# Patient Record
Sex: Female | Born: 1990 | Race: Black or African American | Hispanic: No | Marital: Single | Smoking: Never smoker
Health system: Southern US, Community
[De-identification: ages and names within clinical notes are randomized; demographics above are authoritative.]

## PROBLEM LIST (undated history)

## (undated) ENCOUNTER — Inpatient Hospital Stay (HOSPITAL_COMMUNITY): Payer: Self-pay

## (undated) DIAGNOSIS — F419 Anxiety disorder, unspecified: Secondary | ICD-10-CM

## (undated) HISTORY — PX: NO PAST SURGERIES: SHX2092

---
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
## 2010-12-17 ENCOUNTER — Emergency Department (HOSPITAL_COMMUNITY)
Admission: EM | Admit: 2010-12-17 | Discharge: 2010-12-17 | Discharge status: Against Medical Advice | Payer: Self-pay | Attending: Emergency Medicine | Admitting: Emergency Medicine

## 2010-12-17 DIAGNOSIS — R109 Unspecified abdominal pain: Secondary | ICD-10-CM | POA: Insufficient documentation

## 2010-12-17 DIAGNOSIS — R42 Dizziness and giddiness: Secondary | ICD-10-CM | POA: Insufficient documentation

## 2010-12-17 LAB — URINALYSIS, ROUTINE W REFLEX MICROSCOPIC
Bilirubin Urine: NEGATIVE
Glucose, UA: NEGATIVE mg/dL
Protein, ur: NEGATIVE mg/dL
pH: 6 (ref 5.0–8.0)

## 2010-12-22 ENCOUNTER — Emergency Department (HOSPITAL_COMMUNITY)
Admission: EM | Admit: 2010-12-22 | Discharge: 2010-12-22 | Disposition: A | Discharge status: Home / Self Care | Payer: Self-pay | Attending: Emergency Medicine | Admitting: Emergency Medicine

## 2010-12-22 DIAGNOSIS — B9689 Other specified bacterial agents as the cause of diseases classified elsewhere: Secondary | ICD-10-CM | POA: Insufficient documentation

## 2010-12-22 DIAGNOSIS — N72 Inflammatory disease of cervix uteri: Secondary | ICD-10-CM | POA: Insufficient documentation

## 2010-12-22 DIAGNOSIS — N760 Acute vaginitis: Secondary | ICD-10-CM | POA: Insufficient documentation

## 2010-12-22 DIAGNOSIS — A499 Bacterial infection, unspecified: Secondary | ICD-10-CM | POA: Insufficient documentation

## 2010-12-22 LAB — URINALYSIS, ROUTINE W REFLEX MICROSCOPIC
Glucose, UA: NEGATIVE mg/dL
Hgb urine dipstick: NEGATIVE
Specific Gravity, Urine: 1.026 (ref 1.005–1.030)
pH: 6.5 (ref 5.0–8.0)

## 2010-12-22 LAB — WET PREP, GENITAL
Trich, Wet Prep: NONE SEEN
Yeast Wet Prep HPF POC: NONE SEEN

## 2010-12-23 LAB — GC/CHLAMYDIA PROBE AMP, GENITAL: GC Probe Amp, Genital: NEGATIVE

## 2010-12-28 LAB — CBC
Hemoglobin: 10.4 g/dL — ABNORMAL LOW (ref 12.0–15.0)
MCHC: 34.4 g/dL (ref 30.0–36.0)
MCV: 79.1 fL (ref 78.0–100.0)
Platelets: 179 10*3/uL (ref 150–400)
Platelets: 193 10*3/uL (ref 150–400)
RDW: 13.8 % (ref 11.5–15.5)
WBC: 14.7 10*3/uL — ABNORMAL HIGH (ref 4.0–10.5)

## 2010-12-28 LAB — DIFFERENTIAL
Basophils Relative: 0 % (ref 0–1)
Eosinophils Absolute: 0 10*3/uL (ref 0.0–0.7)
Lymphs Abs: 1.2 10*3/uL (ref 0.7–4.0)
Neutrophils Relative %: 89 % — ABNORMAL HIGH (ref 43–77)

## 2010-12-28 LAB — ABO/RH: ABO/RH(D): B POS

## 2010-12-28 LAB — HEPATITIS B SURFACE ANTIGEN: Hepatitis B Surface Ag: NEGATIVE

## 2010-12-28 LAB — HIV ANTIBODY (ROUTINE TESTING W REFLEX): HIV: NONREACTIVE

## 2010-12-29 LAB — POCT I-STAT, CHEM 8
BUN: 8 mg/dL (ref 6–23)
Chloride: 102 mEq/L (ref 96–112)
Sodium: 135 mEq/L (ref 135–145)

## 2010-12-29 LAB — URINALYSIS, ROUTINE W REFLEX MICROSCOPIC
Glucose, UA: NEGATIVE mg/dL
Hgb urine dipstick: NEGATIVE
Ketones, ur: NEGATIVE mg/dL
Protein, ur: NEGATIVE mg/dL

## 2010-12-29 LAB — ABO/RH: ABO/RH(D): B POS

## 2010-12-29 LAB — HCG, QUANTITATIVE, PREGNANCY: hCG, Beta Chain, Quant, S: 36401 m[IU]/mL — ABNORMAL HIGH (ref <5)

## 2010-12-29 LAB — WET PREP, GENITAL
Trich, Wet Prep: NONE SEEN
Yeast Wet Prep HPF POC: NONE SEEN

## 2011-01-20 ENCOUNTER — Emergency Department (HOSPITAL_COMMUNITY)
Admission: EM | Admit: 2011-01-20 | Discharge: 2011-01-20 | Disposition: A | Discharge status: Home / Self Care | Payer: Self-pay | Attending: Emergency Medicine | Admitting: Emergency Medicine

## 2011-01-20 DIAGNOSIS — H109 Unspecified conjunctivitis: Secondary | ICD-10-CM | POA: Insufficient documentation

## 2011-01-20 DIAGNOSIS — H5710 Ocular pain, unspecified eye: Secondary | ICD-10-CM | POA: Insufficient documentation

## 2011-02-04 NOTE — Discharge Summary (Signed)
Emily Oconnor, Emily Oconnor            ACCOUNT NO.:  [id number]   MEDICAL RECORD NO.:  [id number]          PATIENT TYPE:  OBV   LOCATION:  9305                          FACILITY:  [facility]   PHYSICIAN:  Scheryl Darter, MD       DATE OF BIRTH:  07/07/1991   DATE OF ADMISSION:  04/28/2009  DATE OF DISCHARGE:  04/28/2009                               DISCHARGE SUMMARY   DIAGNOSIS:  Spontaneous miscarriage at 50 week's gestation.   HISTORY OF PRESENT ILLNESS:  The patient is an 20 year old black female,  gravida 1, para 0-0-1-0, who had prenatal care at [facility].  She was seen for colorless bleeding and pain at [facility] yesterday evening and was discharged home and shortly after  returning home, she had spontaneous passage of 15 week fetus.  EMS was  called, and they transported her to this facility.   PAST MEDICAL HISTORY:  Asthma and anxiety.   PAST SURGICAL HISTORY:  None.   SOCIAL HISTORY:  The patient denies substance use.   MEDICATIONS:  None.   ALLERGIES:  No known drug allergies.   PHYSICAL EXAMINATION:  GENERAL:  The patient was in no acute distress.  VITAL SIGNS:  Stable.  CHEST:  Clear.  HEART:  Regular rate and rhythm.  ABDOMEN:  Soft and nontender.   Pelvic exam revealed spontaneous delivery of placenta in the Maternity  Admissions Unit.  Hemoglobin was 10.4 and platelet count 179,000.  Blood  type B positive.  The patient received IV pitocin.  Her bleeding became  scant.  She requested extended observation after miscarriage.  She was  discharged home from observation.  She wished to follow up at her  OB/GYN's office in 2 days.  She was given bleeding precautions.      Scheryl Darter, MD  Electronically Signed     JA/MEDQ  D:  04/28/2009  T:  04/28/2009  Job:  [id number]

## 2011-11-10 ENCOUNTER — Other Ambulatory Visit (HOSPITAL_COMMUNITY)
Admission: RE | Admit: 2011-11-10 | Discharge: 2011-11-10 | Disposition: A | Discharge status: Home / Self Care | Payer: Self-pay | Source: Ambulatory Visit | Attending: Obstetrics & Gynecology | Admitting: Obstetrics & Gynecology

## 2011-11-10 DIAGNOSIS — Z124 Encounter for screening for malignant neoplasm of cervix: Secondary | ICD-10-CM | POA: Insufficient documentation

## 2011-11-10 DIAGNOSIS — Z113 Encounter for screening for infections with a predominantly sexual mode of transmission: Secondary | ICD-10-CM | POA: Insufficient documentation

## 2012-02-07 ENCOUNTER — Encounter (HOSPITAL_COMMUNITY): Payer: Self-pay | Admitting: Emergency Medicine

## 2012-02-07 ENCOUNTER — Emergency Department (HOSPITAL_COMMUNITY)
Admission: EM | Admit: 2012-02-07 | Discharge: 2012-02-07 | Disposition: A | Discharge status: Home / Self Care | Payer: Self-pay | Attending: Emergency Medicine | Admitting: Emergency Medicine

## 2012-02-07 DIAGNOSIS — J45909 Unspecified asthma, uncomplicated: Secondary | ICD-10-CM | POA: Insufficient documentation

## 2012-02-07 DIAGNOSIS — IMO0001 Reserved for inherently not codable concepts without codable children: Secondary | ICD-10-CM | POA: Insufficient documentation

## 2012-02-07 DIAGNOSIS — R51 Headache: Secondary | ICD-10-CM | POA: Insufficient documentation

## 2012-02-07 DIAGNOSIS — J069 Acute upper respiratory infection, unspecified: Secondary | ICD-10-CM | POA: Insufficient documentation

## 2012-02-07 NOTE — Discharge Instructions (Signed)
Upper Respiratory Infection, Adult An upper respiratory infection (URI) is also known as the common cold. It is often caused by a type of germ (virus). Colds are easily spread (contagious). You can pass it to others by kissing, coughing, sneezing, or drinking out of the same glass. Usually, you get better in 1 or 2 weeks.  HOME CARE   Only take medicine as told by your doctor.   Use a warm mist humidifier or breathe in steam from a hot shower.   Drink enough water and fluids to keep your pee (urine) clear or pale yellow.   Get plenty of rest.   Return to work when your temperature is back to normal or as told by your doctor. You may use a face mask and wash your hands to stop your cold from spreading.  GET HELP RIGHT AWAY IF:   After the first few days, you feel you are getting worse.   You have questions about your medicine.   You have chills, shortness of breath, or brown or red spit (mucus).   You have yellow or brown snot (nasal discharge) or pain in the face, especially when you bend forward.   You have a fever, puffy (swollen) neck, pain when you swallow, or white spots in the back of your throat.   You have a bad headache, ear pain, sinus pain, or chest pain.   You have a high-pitched whistling sound when you breathe in and out (wheezing).   You have a lasting cough or cough up blood.   You have sore muscles or a stiff neck.  MAKE SURE YOU:   Understand these instructions.   Will watch your condition.   Will get help right away if you are not doing well or get worse.  Document Released: 02/25/2008 Document Revised: 08/28/2011 Document Reviewed: 01/13/2011 [facility] Patient Information 2012 [place], [place].          Cool Mist Vaporizers Vaporizers may help relieve the symptoms of a cough and cold. By adding water to the air, mucus may become thinner and less sticky. This makes it easier to breathe and cough up secretions. Vaporizers have not been proven to show  they help with colds. You should not use a vaporizer if you are allergic to mold. Cool mist vaporizers do not cause serious burns like hot mist vaporizers ("steamers"). HOME CARE INSTRUCTIONS  Follow the package instructions for your vaporizer.   Use a vaporizer that holds a large volume of water (1 to 2 gallons [5.7 to 7.5 liters]).   Do not use anything other than distilled water in the vaporizer.   Do not run the vaporizer all of the time. This can cause mold or bacteria to grow in the vaporizer.   Clean the vaporizer after each time you use it.   Clean and dry the vaporizer well before you store it.   Stop using a vaporizer if you develop worsening respiratory symptoms.  Document Released: 06/05/2004 Document Revised: 08/28/2011 Document Reviewed: 05/03/2009 [facility] Patient Information 2012 [place], [place].        If you have no primary doctor, here are some resources that may be helpful:  Medicaid-accepting [facility] Providers:   - [facility]- [address]      [phone]      Mon-Fri 9am-7pm, Sat 9am-1pm   - [facility]- [address] [place], [address] [place]      [phone]   - [facility]- [address] [place]      [phone]   [facility]- [address]      [phone]   - Renaye Rakers- [address] [place], [address]      [phone]      Only accepts [place] patients       after they have her name applied to their card   Self Pay (no insurance) in [place]:   - Sickle Cell Patients: Dr Willey Blade, [facility]      [address] [place]      [phone]   - [phone]   - Physician Referral Service- [phone]   - [facility]- [address] [place]      [phone]   [facility] [place]- [address]   - [facility]- see information above      (Speak to [employer] if you  do not have insurance)   - [facility]- [address] [place]      [phone]   - [facility] [place]- [address]      [phone]   - [facility]- [address]      [phone]   - Dr Julio Sicks-  [address], [place]      [phone]   - [facility]- [address]      [phone]   - [facility]- [address]      [phone]      Also [address]      [phone]   - [facility]- [address]      [phone]      1st and 3rd Saturday every month, 10am-1pm Other agencies that provide inexpensive medical care:    [facility]  [phone]    [facility]  [phone]    [facility]  [phone]    [facility]  [phone]    [facility]  [phone]  General Information: Finding a doctor when you do not have health insurance can be tricky. Although you are not limited by an insurance plan, you are of course limited by her finances and how much but he can pay out of pocket.  What are your options if you don't have health insurance?   1) Find a Librarian, academic and Pay Out of Pocket Although you won't have to find out who is covered by your insurance plan, it is a good idea to ask around and get recommendations. You will then need to call the office and see if the doctor you have chosen will accept you as a new patient and what types of options they offer for patients who are self-pay. Some doctors offer discounts or will set up payment plans for their patients who do not have insurance, but you will need to ask so you aren't surprised when you get to your appointment.  2) Contact Your Local Health Department Not all health departments have doctors that can see patients for sick visits, but many do, so it is worth a call to see if yours does. If you don't know where your local health department is, you can check in your phone book. The CDC also has a tool to help you locate  your state's health department, and many state websites also have  listings of all of their local health departments.  3) Find a Walk-in Clinic If your illness is not likely to be very severe or complicated, you may want to try a walk in clinic. These are popping up all over the country in pharmacies, drugstores, and shopping centers. They're usually staffed by nurse practitioners or physician assistants that have been trained to treat common illnesses and complaints. They're usually fairly quick and inexpensive. However, if you have serious medical issues or chronic medical problems, these are probably not your best option

## 2012-02-07 NOTE — ED Provider Notes (Signed)
Medical screening examination/treatment/procedure(s) were performed by non-physician practitioner and as supervising physician I was immediately available for consultation/collaboration.  Jackelyne Sayer, MD 02/07/12 1626 

## 2012-02-07 NOTE — ED Notes (Signed)
Pt report cough, sore throat and chills x 2 days

## 2012-02-07 NOTE — ED Provider Notes (Signed)
History     CSN: [id number]  Arrival date & time 02/07/12  4098   First MD Initiated Contact with Patient 02/07/12 1012      Chief Complaint  Patient presents with  . Cough     2 day hx of cough  . Sore Throat    (Consider location/radiation/quality/duration/timing/severity/associated sxs/prior treatment) Patient is a 21 y.o. female presenting with pharyngitis. The history is provided by the patient.  Sore Throat This is a new problem. Episode onset: 2 days ago. The problem occurs constantly. The problem has been unchanged. Associated symptoms include congestion, coughing, headaches, myalgias and a sore throat. Pertinent negatives include no abdominal pain, chest pain, chills, fever, neck pain, rash, swollen glands or vomiting. The symptoms are aggravated by swallowing. Treatments tried: Robitussin. The treatment provided no relief.    Past Medical History  Diagnosis Date  . Asthma     History reviewed. No pertinent past surgical history.  Family History  Problem Relation Age of Onset  . Diabetes Mother   . Hypertension Mother   . Cancer Mother     History  Substance Use Topics  . Smoking status: Never Smoker   . Smokeless tobacco: Not on file  . Alcohol Use: No     Review of Systems  Constitutional: Negative for fever and chills.  HENT: Positive for congestion and sore throat. Negative for neck pain.   Respiratory: Positive for cough.   Cardiovascular: Negative for chest pain.  Gastrointestinal: Negative for vomiting and abdominal pain.  Musculoskeletal: Positive for myalgias.  Skin: Negative for rash.  Neurological: Positive for headaches.    Allergies  Review of patient's allergies indicates no known allergies.  Home Medications   Current Outpatient Rx  Name Route Sig Dispense Refill  . GUAIFENESIN 100 MG/5ML PO LIQD Oral Take 400 mg by mouth 3 (three) times daily as needed. Cold symptons      BP 107/70  Pulse 98  Temp(Src) 99.5 F (37.5 C)  (Oral)  SpO2 99%  LMP 01/18/2012  Physical Exam  Nursing note reviewed. Constitutional:       Vital signs are reviewed and are normal.   HENT:  Head: Normocephalic and atraumatic.  Right Ear: Hearing, tympanic membrane, external ear and ear canal normal.  Left Ear: Hearing, tympanic membrane, external ear and ear canal normal.  Nose: Rhinorrhea present. Right sinus exhibits no maxillary sinus tenderness. Left sinus exhibits no maxillary sinus tenderness.  Mouth/Throat: Uvula is midline and mucous membranes are normal. No oropharyngeal exudate, posterior oropharyngeal edema, posterior oropharyngeal erythema or tonsillar abscesses.  Neck: Normal range of motion. Neck supple.       Phonation normal  Cardiovascular: Normal rate, regular rhythm and normal heart sounds.   Pulmonary/Chest: Effort normal and breath sounds normal. No respiratory distress. She has no wheezes. She exhibits no tenderness.  Abdominal: Soft. Bowel sounds are normal. She exhibits no distension. There is no tenderness.  Musculoskeletal: She exhibits no edema and no tenderness.  Lymphadenopathy:    She has no cervical adenopathy.  Neurological: She is alert.       MS appears baseline for pt. Speech clear  Skin: Skin is warm and dry.  Psychiatric: She has a normal mood and affect.    ED Course  Procedures (including critical care time)  Labs Reviewed - No data to display No results found.  Dx 1: URI  MDM  Viral URI s/s. Non-toxic appearing, AF, nml VS.   Discussed symptomatic tx with pt, who voices  understanding.      Shaaron Adler, PA-C 02/07/12 1041

## 2012-07-15 ENCOUNTER — Emergency Department (HOSPITAL_COMMUNITY)
Admission: EM | Admit: 2012-07-15 | Discharge: 2012-07-15 | Disposition: A | Discharge status: Home / Self Care | Payer: Self-pay | Attending: Emergency Medicine | Admitting: Emergency Medicine

## 2012-07-15 ENCOUNTER — Encounter (HOSPITAL_COMMUNITY): Payer: Self-pay | Admitting: Emergency Medicine

## 2012-07-15 DIAGNOSIS — G5601 Carpal tunnel syndrome, right upper limb: Secondary | ICD-10-CM

## 2012-07-15 DIAGNOSIS — G5600 Carpal tunnel syndrome, unspecified upper limb: Secondary | ICD-10-CM | POA: Insufficient documentation

## 2012-07-15 DIAGNOSIS — J45909 Unspecified asthma, uncomplicated: Secondary | ICD-10-CM | POA: Insufficient documentation

## 2012-07-15 DIAGNOSIS — Z3202 Encounter for pregnancy test, result negative: Secondary | ICD-10-CM | POA: Insufficient documentation

## 2012-07-15 DIAGNOSIS — Z8709 Personal history of other diseases of the respiratory system: Secondary | ICD-10-CM | POA: Insufficient documentation

## 2012-07-15 MED ORDER — IBUPROFEN 800 MG PO TABS: 800.0000 mg | ORAL_TABLET | Freq: Three times a day (TID) | ORAL | Status: DC

## 2012-07-15 MED ORDER — KETOROLAC TROMETHAMINE 10 MG PO TABS
10.0000 mg | ORAL_TABLET | Freq: Once | ORAL | Status: AC
  Administered 2012-07-15: 10 mg via ORAL
  Filled 2012-07-15: qty 1

## 2012-07-15 NOTE — ED Notes (Signed)
Pt reports right wrist pain for several days.  Pt states she thinks it might be carpal tunnel because she writes a lot for school.  Pt denies any acute injury.  No swelling or deformity noted.

## 2012-07-15 NOTE — ED Provider Notes (Signed)
History     CSN: [id number]  Arrival date & time 07/15/12  1033   First MD Initiated Contact with Patient 07/15/12 1104      Chief Complaint  Patient presents with  . Wrist Pain    (Consider location/radiation/quality/duration/timing/severity/associated sxs/prior treatment) HPI Comments: Patient is an otherwise healthy 21 year old female who presents with right wrist pain for the past 4 days. 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 The patient is right handed. The pain is described as sharp and severe and radiates into her right hand. Patient reports associated numbness and tingling of right hand. Right wrist movement makes the pain worse. Nothing makes the pain better. Patient has tried nothing for symptoms relief. Patient denies known injury, obvious deformity, weakness/coolness of extremity, neck pain, back pain, headache, visual changes.    Past Medical History  Diagnosis Date  . Asthma     History reviewed. No pertinent past surgical history.  Family History  Problem Relation Age of Onset  . Diabetes Mother   . Hypertension Mother   . Cancer Mother     History  Substance Use Topics  . Smoking status: Never Smoker   . Smokeless tobacco: Not on file  . Alcohol Use: No    OB History    Grav Para Term Preterm Abortions TAB SAB Ect Mult Living                  Review of Systems  Musculoskeletal: Positive for arthralgias.  Neurological: Positive for numbness.  All other systems reviewed and are negative.    Allergies  Review of patient's allergies indicates no known allergies.  Home Medications   Current Outpatient Rx  Name Route Sig Dispense Refill  . GUAIFENESIN 100 MG/5ML PO LIQD Oral Take 400 mg by mouth 3 (three) times daily as needed. Cold symptons      BP 123/80  Pulse 78  Temp 98 F (36.7 C) (Oral)  Resp 16  SpO2 96%  LMP 05/27/2012  Physical Exam   Nursing note and vitals reviewed. Constitutional: She is oriented to person, place, and time. She appears well-developed and well-nourished. No distress.  HENT:  Head: Normocephalic and atraumatic.  Eyes: Conjunctivae normal are normal.  Neck: Normal range of motion. Neck supple.  Cardiovascular: Normal rate and regular rhythm.  Exam reveals no gallop and no friction rub.   No murmur heard. Pulmonary/Chest: Effort normal and breath sounds normal. She has no wheezes. She has no rales. She exhibits no tenderness.  Abdominal: Soft. She exhibits no distension.  Musculoskeletal: Normal range of motion.       Right wrist tender to palpation over volar aspect. No edema noted. Phalen test positive. No other joint pain.   Neurological: She is alert and oriented to person, place, and time. Coordination normal.       Strength and sensation equal and intact bilaterally. Speech is goal-oriented. Moves limbs without ataxia.   Skin: Skin is warm and dry. She is not diaphoretic.  Psychiatric: She has a normal mood and affect. Her behavior is normal.    ED Course  Procedures (including critical care time)  Labs Reviewed - No data to display No results found.   1. Carpal tunnel syndrome on right       MDM  11:44 AM Patient's history classic for carpal tunnel syndrome. Patient will be discharged with anti-inflammatory medication  and instructions to splint her wrist for inflammation reduction. Patient reports having a splint at home and does not need one here. Patient requests a urine pregnancy test.   12:16 PM Urine pregnancy negative. Patient will be discharged with ibuprofen and information about carpal tunnel syndrome. No further evaluation needed at this time.       Emilia Beck, PA-C 07/15/12 1232

## 2012-07-15 NOTE — ED Provider Notes (Signed)
Medical screening examination/treatment/procedure(s) were performed by non-physician practitioner and as supervising physician I was immediately available for consultation/collaboration.   Rolan Bucco, MD 07/15/12 1447

## 2013-02-20 ENCOUNTER — Emergency Department (HOSPITAL_COMMUNITY)
Admission: EM | Admit: 2013-02-20 | Discharge: 2013-02-20 | Disposition: A | Discharge status: Home / Self Care | Payer: Medicaid Other | Attending: Emergency Medicine | Admitting: Emergency Medicine

## 2013-02-20 ENCOUNTER — Encounter (HOSPITAL_COMMUNITY): Payer: Self-pay | Admitting: Family Medicine

## 2013-02-20 ENCOUNTER — Emergency Department (HOSPITAL_COMMUNITY): Payer: Medicaid Other

## 2013-02-20 DIAGNOSIS — J45909 Unspecified asthma, uncomplicated: Secondary | ICD-10-CM | POA: Insufficient documentation

## 2013-02-20 DIAGNOSIS — O2690 Pregnancy related conditions, unspecified, unspecified trimester: Secondary | ICD-10-CM | POA: Insufficient documentation

## 2013-02-20 DIAGNOSIS — R110 Nausea: Secondary | ICD-10-CM | POA: Insufficient documentation

## 2013-02-20 DIAGNOSIS — O26899 Other specified pregnancy related conditions, unspecified trimester: Secondary | ICD-10-CM

## 2013-02-20 LAB — HCG, QUANTITATIVE, PREGNANCY: hCG, Beta Chain, Quant, S: 55184 m[IU]/mL — ABNORMAL HIGH (ref <5)

## 2013-02-20 LAB — WET PREP, GENITAL: Yeast Wet Prep HPF POC: NONE SEEN

## 2013-02-20 LAB — URINALYSIS, ROUTINE W REFLEX MICROSCOPIC
Leukocytes, UA: NEGATIVE
Nitrite: NEGATIVE
Specific Gravity, Urine: 1.023 (ref 1.005–1.030)
Urobilinogen, UA: 0.2 mg/dL (ref 0.0–1.0)

## 2013-02-20 MED ORDER — ACETAMINOPHEN 325 MG PO TABS
650.0000 mg | ORAL_TABLET | Freq: Once | ORAL | Status: DC
  Filled 2013-02-20: qty 2

## 2013-02-20 MED ORDER — ONDANSETRON 8 MG PO TBDP: 8.0000 mg | ORAL_TABLET | Freq: Three times a day (TID) | ORAL | Status: DC | PRN

## 2013-02-20 MED ORDER — ONDANSETRON 8 MG PO TBDP
8.0000 mg | ORAL_TABLET | Freq: Once | ORAL | Status: AC
  Administered 2013-02-20: 8 mg via ORAL
  Filled 2013-02-20: qty 1

## 2013-02-20 NOTE — ED Provider Notes (Signed)
History     CSN: [id number]  Arrival date & time 02/20/13  0046   First MD Initiated Contact with Patient 02/20/13 0127      Chief Complaint  Patient presents with  . Abdominal Pain  . Abdominal Cramping    (Consider location/radiation/quality/duration/timing/severity/associated sxs/prior treatment) HPI 22 year old G2, P1, at 7 weeks presents to emergency room complaining of lower abdominal cramping.  Patient, reports she's had cramping throughout this pregnancy, but it was much worse.  Tonight.  She has had nausea, but no vomiting.  No fever, no urinary symptoms, no vaginal discharge, specifically, no vaginal bleeding.  Patient's first pregnancy ended at 4 months gestation.  Patient reports she has first OB visit in one to 2 weeks.  No prior ultrasounds during this pregnancy. Past Medical History  Diagnosis Date  . Asthma     History reviewed. No pertinent past surgical history.  Family History  Problem Relation Age of Onset  . Diabetes Mother   . Hypertension Mother   . Cancer Mother     History  Substance Use Topics  . Smoking status: Never Smoker   . Smokeless tobacco: Not on file  . Alcohol Use: No    OB History   Grav Para Term Preterm Abortions TAB SAB Ect Mult Living   1               Review of Systems  See History of Present Illness; otherwise all other systems are reviewed and negative Allergies  Review of patient's allergies indicates no known allergies.  Home Medications   Current Outpatient Rx  Name  Route  Sig  Dispense  Refill  . Prenatal Vit-Fe Fumarate-FA (PRENATAL MULTIVITAMIN) TABS   Oral   Take 1 tablet by mouth every morning.         . ondansetron (ZOFRAN-ODT) 8 MG disintegrating tablet   Oral   Take 1 tablet (8 mg total) by mouth every 8 (eight) hours as needed for nausea.   20 tablet   0     BP 134/68  Pulse 85  Temp(Src) 98.1 F (36.7 C) (Oral)  Resp 18  SpO2 100%  LMP 12/29/2011  Physical Exam  Nursing note and  vitals reviewed. Constitutional: She is oriented to person, place, and time. She appears well-developed and well-nourished. She appears distressed (uncomfortable appearing).  HENT:  Head: Normocephalic and atraumatic.  Nose: Nose normal.  Mouth/Throat: Oropharynx is clear and moist.  Eyes: Conjunctivae and EOM are normal. Pupils are equal, round, and reactive to light.  Neck: Normal range of motion. Neck supple. No JVD present. No tracheal deviation present. No thyromegaly present.  Cardiovascular: Normal rate, regular rhythm, normal heart sounds and intact distal pulses.  Exam reveals no gallop and no friction rub.   No murmur heard. Pulmonary/Chest: Effort normal and breath sounds normal. No stridor. No respiratory distress. She has no wheezes. She has no rales. She exhibits no tenderness.  Abdominal: Soft. Bowel sounds are normal. She exhibits no distension and no mass. There is tenderness (suprapubic tenderness). There is no rebound and no guarding.  Genitourinary:  External genitalia normal Vagina with discharge Cervix closed no lesions No cervical motion tenderness Adnexa palpated, no masses or tenderness noted Bladder palpated no tenderness Uterus palpated mild diffuse tenderness    Musculoskeletal: Normal range of motion. She exhibits no edema and no tenderness.  Lymphadenopathy:    She has no cervical adenopathy.  Neurological: She is alert and oriented to person, place, and time. She exhibits  normal muscle tone. Coordination normal.  Skin: Skin is warm and dry. No rash noted. No erythema. No pallor.  Psychiatric: She has a normal mood and affect. Her behavior is normal. Judgment and thought content normal.    ED Course  Procedures (including critical care time)  Labs Reviewed  WET PREP, GENITAL - Abnormal; Notable for the following:    Clue Cells Wet Prep HPF POC MODERATE (*)    WBC, Wet Prep HPF POC FEW (*)    All other components within normal limits  HCG,  QUANTITATIVE, PREGNANCY - Abnormal; Notable for the following:    hCG, Beta Chain, Quant, S 16109 (*)    All other components within normal limits  PREGNANCY, URINE - Abnormal; Notable for the following:    Preg Test, Ur POSITIVE (*)    All other components within normal limits  GC/CHLAMYDIA PROBE AMP  URINALYSIS, ROUTINE W REFLEX MICROSCOPIC   US Ob Transvaginal  02/20/2013   *RADIOLOGY REPORT*  Clinical Data: Pregnant, abdominal pain  OBSTETRIC <14 WK [place] AND TRANSVAGINAL OB US  Technique:  Both transabdominal and transvaginal ultrasound examinations were performed for complete evaluation of the gestation as well as the maternal uterus, adnexal regions, and pelvic cul-de-sac.  Transvaginal technique was performed to assess early pregnancy.  Comparison:  None.  Intrauterine gestational sac:  Visualized/normal in shape. Yolk sac: Identified Embryo: Identified Cardiac Activity: Identified Heart Rate: 152 bpm  CRL: 11.5  mm  7 w  2 d             [place] EDC: 10/07/2013  Maternal uterus/adnexae: No subchorionic hemorrhage.  Ovaries not seen.  Trace free fluid.  IMPRESSION: Single intrauterine gestation with cardiac activity documented. Estimated age of 7 weeks 2 days by crown-rump length.   Original Report Authenticated By: Jearld Lesch, M.D.     1. Abdominal pain in pregnancy       MDM  11 -year-old female G2, P1 at 7 weeks with lower abdominal pain.  No signs of urinary tract infection, no overwhelming, vaginal discharge.  No ectopic on ultrasound.  Seven-week two-day fetus with cardiac activity noted.  Patient does have good followup with her OB.  Pain is better after Tylenol.        Olivia Mackie, MD 02/20/13 [phone]

## 2013-02-20 NOTE — ED Notes (Addendum)
This is patient's second pregnancy; pt had a pre-term delivery at four months with fetal demise.

## 2013-02-20 NOTE — ED Notes (Signed)
Patient states she started having lower abdominal cramping since earlier this evening. States that she has cramped off and on during pregnancy but tonight this is worse. EDD 10/04/2013. Denies vaginal bleeding at this time.

## 2013-02-21 LAB — GC/CHLAMYDIA PROBE AMP
CT Probe RNA: NEGATIVE
GC Probe RNA: NEGATIVE

## 2013-03-07 LAB — OB RESULTS CONSOLE RUBELLA ANTIBODY, IGM: RUBELLA: IMMUNE

## 2013-03-07 LAB — OB RESULTS CONSOLE GC/CHLAMYDIA
Chlamydia: NEGATIVE
Gonorrhea: NEGATIVE

## 2013-03-07 LAB — OB RESULTS CONSOLE RPR: RPR: NONREACTIVE

## 2013-03-07 LAB — OB RESULTS CONSOLE ANTIBODY SCREEN: Antibody Screen: NEGATIVE

## 2013-03-07 LAB — OB RESULTS CONSOLE HIV ANTIBODY (ROUTINE TESTING): HIV: NONREACTIVE

## 2013-04-04 ENCOUNTER — Inpatient Hospital Stay (HOSPITAL_COMMUNITY)
Admission: AD | Admit: 2013-04-04 | Discharge: 2013-04-04 | Disposition: A | Discharge status: Home / Self Care | Payer: Medicaid Other | Source: Ambulatory Visit | Attending: Obstetrics and Gynecology | Admitting: Obstetrics and Gynecology

## 2013-04-04 ENCOUNTER — Encounter (HOSPITAL_COMMUNITY): Payer: Self-pay | Admitting: *Deleted

## 2013-04-04 DIAGNOSIS — O4692 Antepartum hemorrhage, unspecified, second trimester: Secondary | ICD-10-CM

## 2013-04-04 DIAGNOSIS — O209 Hemorrhage in early pregnancy, unspecified: Secondary | ICD-10-CM | POA: Insufficient documentation

## 2013-04-04 DIAGNOSIS — N930 Postcoital and contact bleeding: Secondary | ICD-10-CM

## 2013-04-04 LAB — WET PREP, GENITAL
Clue Cells Wet Prep HPF POC: NONE SEEN
Trich, Wet Prep: NONE SEEN
Yeast Wet Prep HPF POC: NONE SEEN

## 2013-04-04 NOTE — MAU Note (Signed)
PT SAYS SHE STARTED HAVING  VAG BLEEDING  AT 2100-    AFTER HAD SEX.  SAW  DR HENLEY LAST MON- ALL OK.       IN RM 7  ONE SMALL SPOT OF RED BLEEDING.  NO CRAMPS BUT FEELS " GAS'  ON L SIDE.

## 2013-04-04 NOTE — MAU Note (Signed)
Pt reports bleeding after sex tonight.

## 2013-04-04 NOTE — MAU Provider Note (Signed)
History     CSN: [id number]  Arrival date and time: 04/04/13 2102   First Provider Initiated Contact with Patient 04/04/13 2148      Chief Complaint  Patient presents with  . Vaginal Bleeding   HPI [name] is a 22 y.o. G2P0010 at [redacted]w[redacted]d who presents to MAU today with complaint of new onset vaginal bleeding that started around 2100 today after intercourse. The patient states that the bleeding is light, but more than spotting. She denies any abdominal pain, fever or UTI symptoms. She states that she has been having a thick, white discharge prior to bleeding onset that is not irritative or itching.   OB History   Grav Para Term Preterm Abortions TAB SAB Ect Mult Living   2    1  1          Past Medical History  Diagnosis Date  . Asthma     History reviewed. No pertinent past surgical history.  Family History  Problem Relation Age of Onset  . Diabetes Mother   . Hypertension Mother   . Cancer Mother     History  Substance Use Topics  . Smoking status: Never Smoker   . Smokeless tobacco: Not on file  . Alcohol Use: No    Allergies: No Known Allergies  Prescriptions prior to admission  Medication Sig Dispense Refill  . ondansetron (ZOFRAN-ODT) 8 MG disintegrating tablet Take 1 tablet (8 mg total) by mouth every 8 (eight) hours as needed for nausea.  20 tablet  0  . Prenatal Vit-Fe Fumarate-FA (PRENATAL MULTIVITAMIN) TABS Take 1 tablet by mouth every morning.        Review of Systems  Constitutional: Negative for fever and malaise/fatigue.  Gastrointestinal: Positive for nausea. Negative for vomiting and abdominal pain.  Genitourinary: Negative for dysuria, urgency and frequency.       + vaginal bleeding, discharge   Physical Exam   Blood pressure 122/70, pulse 84, temperature 98.6 F (37 C), temperature source Oral, resp. rate 18, height 5\' 6"  (1.676 m), weight 175 lb (79.379 kg), last menstrual period 12/29/2011, SpO2 100.00%.  Physical Exam   Constitutional: She is oriented to person, place, and time. She appears well-developed and well-nourished. No distress.  HENT:  Head: Normocephalic and atraumatic.  Cardiovascular: Normal rate, regular rhythm and normal heart sounds.   Respiratory: Effort normal and breath sounds normal. No respiratory distress.  GI: Soft. Bowel sounds are normal. She exhibits no distension and no mass. There is no tenderness. There is no rebound and no guarding.  Genitourinary: Uterus is enlarged (appropriate for GA). Uterus is not tender. Cervix exhibits no motion tenderness, no discharge and no friability. There is bleeding (small amount of bleeding noted in the vagina) around the vagina. No vaginal discharge found.  Neurological: She is alert and oriented to person, place, and time.  Skin: Skin is warm and dry. No erythema.  Psychiatric: She has a normal mood and affect.   Results for orders placed during the hospital encounter of 04/04/13 (from the past 24 hour(s))  WET PREP, GENITAL     Status: Abnormal   Collection Time    04/04/13 10:00 PM      Result Value Range   Yeast Wet Prep HPF POC NONE SEEN  NONE SEEN   Trich, Wet Prep NONE SEEN  NONE SEEN   Clue Cells Wet Prep HPF POC NONE SEEN  NONE SEEN   WBC, Wet Prep HPF POC FEW (*) NONE SEEN  MAU Course  Procedures None  MDM Wet prep today Discussed with Senaida Ores. Discharge with pelvic rest and bleeding precautions Assessment and Plan  A: Post coital bleeding in pregnancy Vaginal bleeding in pregnancy  P: Discharge home Pelvic rest and bleeding precautions discussed Patient should keep follow-up as scheduled or sooner if symptoms persist or worsen Patient may return to MAU as needed or if her condition were to change or worsen  Freddi Starr, PA-C  04/04/2013, 10:44 PM

## 2013-04-10 ENCOUNTER — Inpatient Hospital Stay (HOSPITAL_COMMUNITY)
Admission: AD | Admit: 2013-04-10 | Discharge: 2013-04-11 | Disposition: A | Discharge status: Home / Self Care | Payer: Medicaid Other | Source: Ambulatory Visit | Attending: Obstetrics and Gynecology | Admitting: Obstetrics and Gynecology

## 2013-04-10 ENCOUNTER — Encounter (HOSPITAL_COMMUNITY): Payer: Self-pay | Admitting: *Deleted

## 2013-04-10 DIAGNOSIS — R55 Syncope and collapse: Secondary | ICD-10-CM

## 2013-04-10 DIAGNOSIS — O2650 Maternal hypotension syndrome, unspecified trimester: Secondary | ICD-10-CM | POA: Insufficient documentation

## 2013-04-10 LAB — CBC
MCV: 74.5 fL — ABNORMAL LOW (ref 78.0–100.0)
Platelets: 183 10*3/uL (ref 150–400)
RDW: 13.7 % (ref 11.5–15.5)
WBC: 8.5 10*3/uL (ref 4.0–10.5)
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
## 2013-04-11 DIAGNOSIS — R55 Syncope and collapse: Secondary | ICD-10-CM

## 2013-04-11 LAB — COMPREHENSIVE METABOLIC PANEL
AST: 13 U/L (ref 0–37)
Albumin: 2.9 g/dL — ABNORMAL LOW (ref 3.5–5.2)
Chloride: 101 mEq/L (ref 96–112)
Creatinine, Ser: 0.56 mg/dL (ref 0.50–1.10)
Total Bilirubin: 0.1 mg/dL — ABNORMAL LOW (ref 0.3–1.2)

## 2013-07-03 ENCOUNTER — Encounter (HOSPITAL_COMMUNITY): Payer: Self-pay | Admitting: *Deleted

## 2013-07-03 ENCOUNTER — Inpatient Hospital Stay (HOSPITAL_COMMUNITY)
Admission: AD | Admit: 2013-07-03 | Discharge: 2013-07-03 | Disposition: A | Discharge status: Home / Self Care | Payer: Medicaid Other | Source: Ambulatory Visit | Attending: Obstetrics and Gynecology | Admitting: Obstetrics and Gynecology

## 2013-07-03 DIAGNOSIS — Y929 Unspecified place or not applicable: Secondary | ICD-10-CM | POA: Insufficient documentation

## 2013-07-03 DIAGNOSIS — O99891 Other specified diseases and conditions complicating pregnancy: Secondary | ICD-10-CM | POA: Insufficient documentation

## 2013-07-03 DIAGNOSIS — W19XXXA Unspecified fall, initial encounter: Secondary | ICD-10-CM

## 2013-07-03 DIAGNOSIS — M25529 Pain in unspecified elbow: Secondary | ICD-10-CM

## 2013-07-03 LAB — URINALYSIS, ROUTINE W REFLEX MICROSCOPIC
Glucose, UA: NEGATIVE mg/dL
Hgb urine dipstick: NEGATIVE
Protein, ur: NEGATIVE mg/dL
Specific Gravity, Urine: 1.025 (ref 1.005–1.030)
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
## 2013-09-22 NOTE — L&D Delivery Note (Signed)
Delivery Note Pt reached complete dilation and pushed great.  At 10:40 PM a healthy female was delivered via  (Presentation: ROA).  APGAR: 8, 9; weight pending .   Placenta status: delivered spontaneously, .  Cord:  with the following complications:  none . Moderate meconium, baby with vigorous cry after delivery.  Anesthesia: Epidural  Episiotomy: none Lacerations: abrasions Suture Repair: 3.0 vicryl rapide for hemostasis Est. Blood Loss (mL): 350cc  Mom to postpartum.  Baby to Couplet care / Skin to Skin.  Oliver PilaRICHARDSON,Lillard Bailon W 10/05/2013, 10:57 PM

## 2013-10-04 IMAGING — US US OB COMP LESS 14 WK
1 series · 14 of 28 positions shown · non-contrast
Comparison: None.

CLINICAL DATA: Pregnant, abdominal pain

OBSTETRIC <14 WK US AND TRANSVAGINAL OB US
TECHNIQUE: Both transabdominal and transvaginal ultrasound
examinations were performed for complete evaluation of the
gestation as well as the maternal uterus, adnexal regions, and
pelvic cul-de-sac. Transvaginal technique was performed to assess
early pregnancy.

[Series 1: us ob comp less 14 wk · 0.24mm/px · 42 acquisitions, 14 frames shown]
[im 2/42]
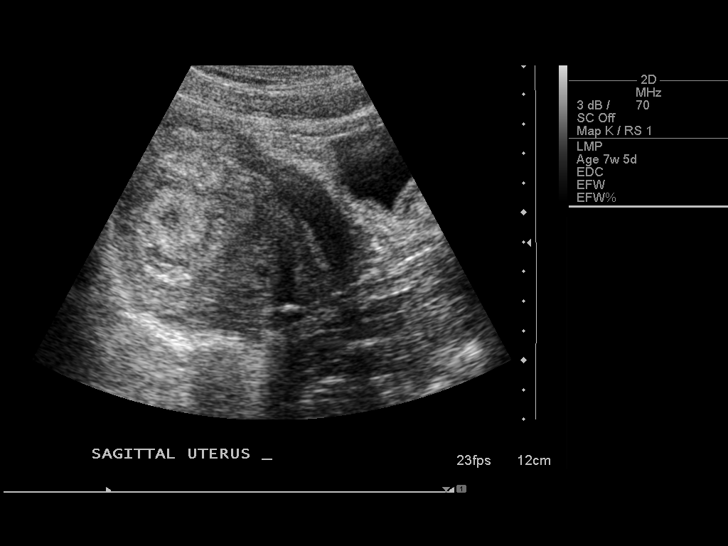
[im 5/42]
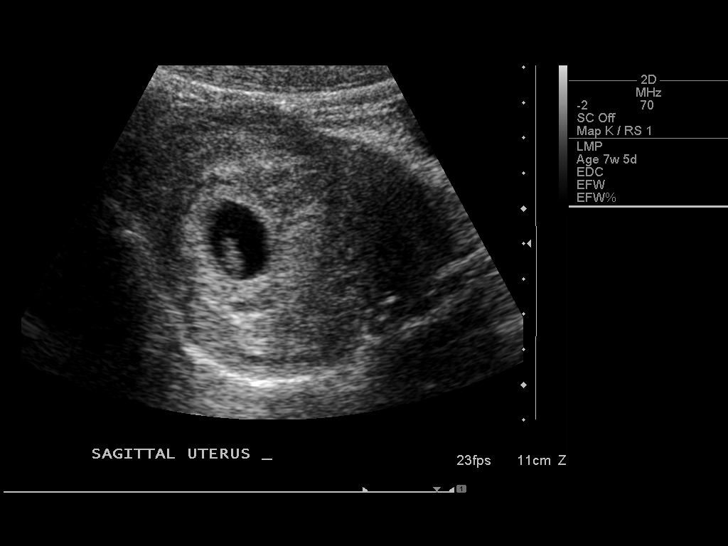
[im 8/42]
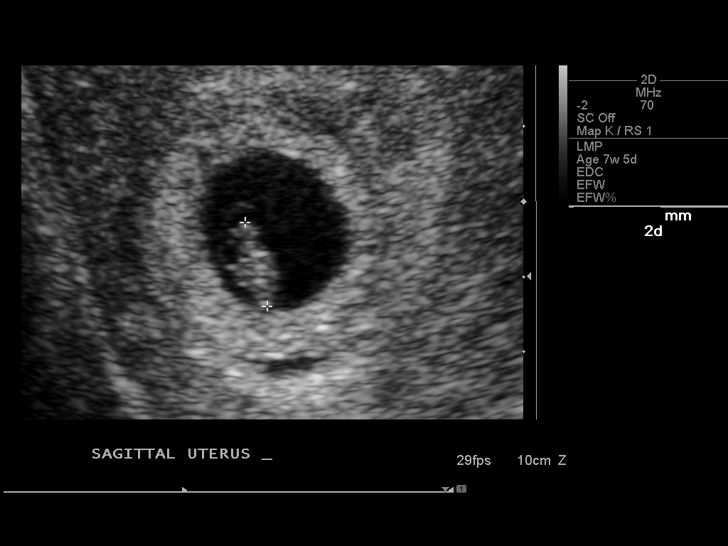
[im 11/42]
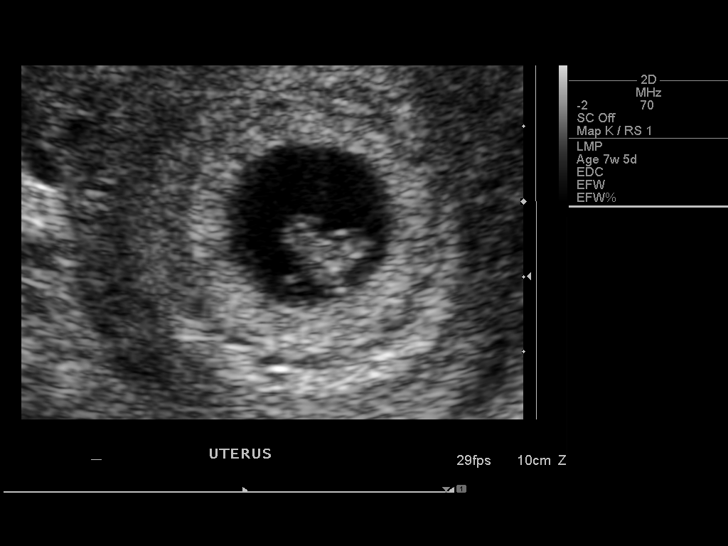
[im 14/42]
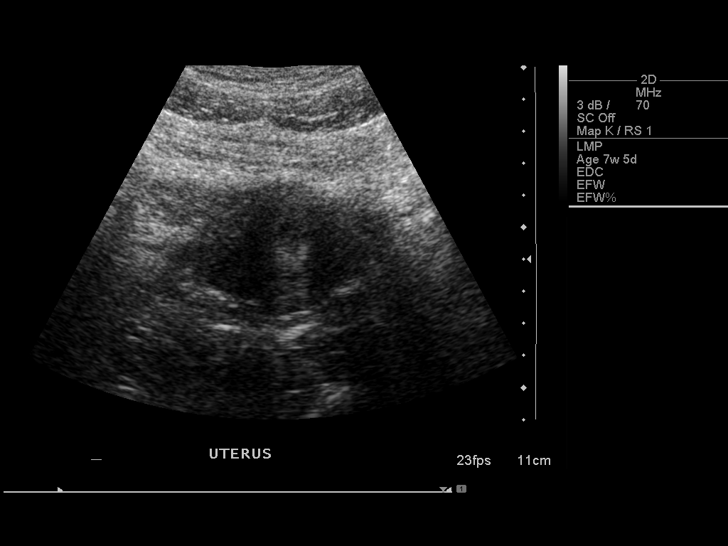
[im 17/42]
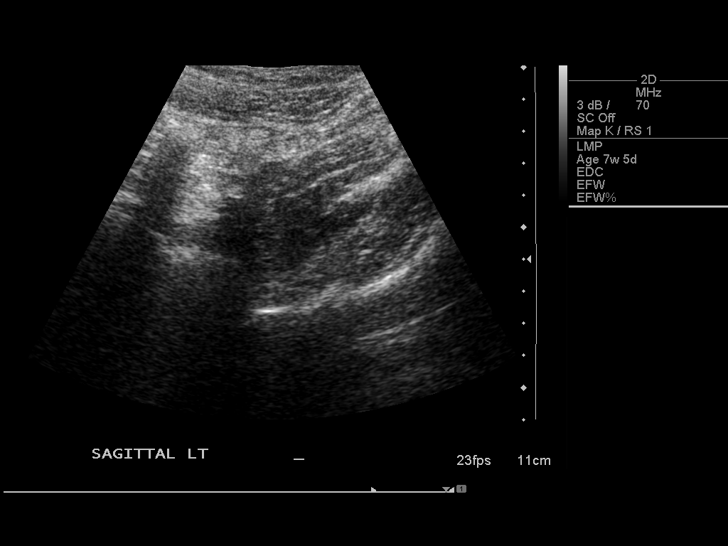
[im 20/42]
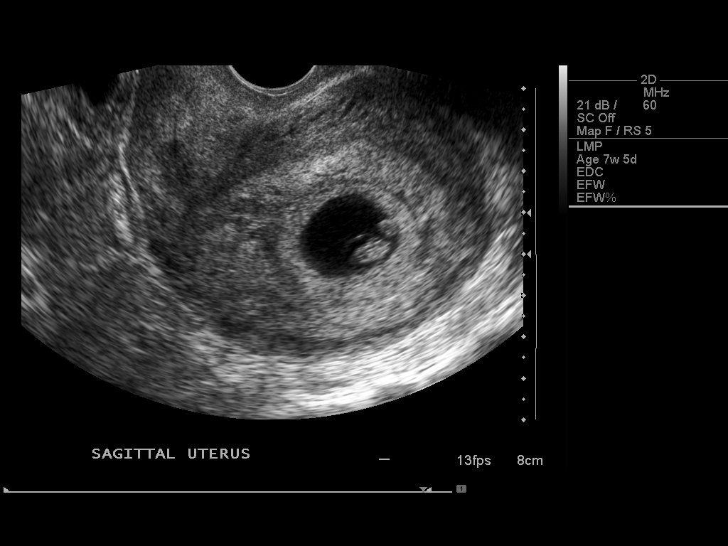
[im 23/42]
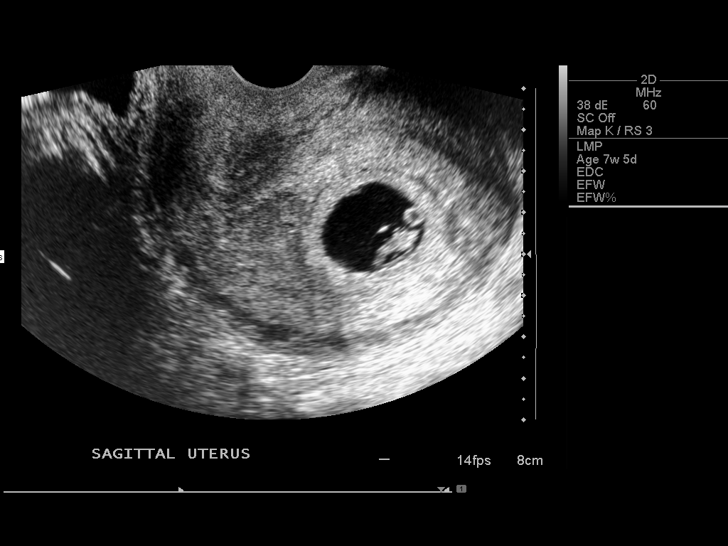
[im 26/42]
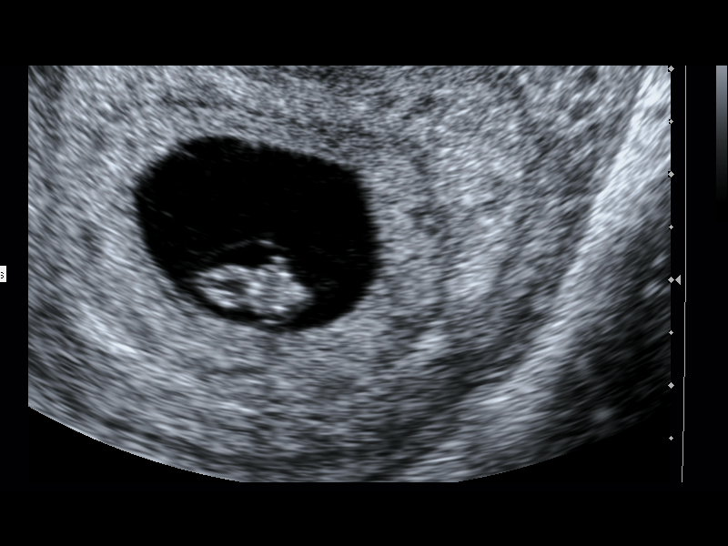
[im 29/42]
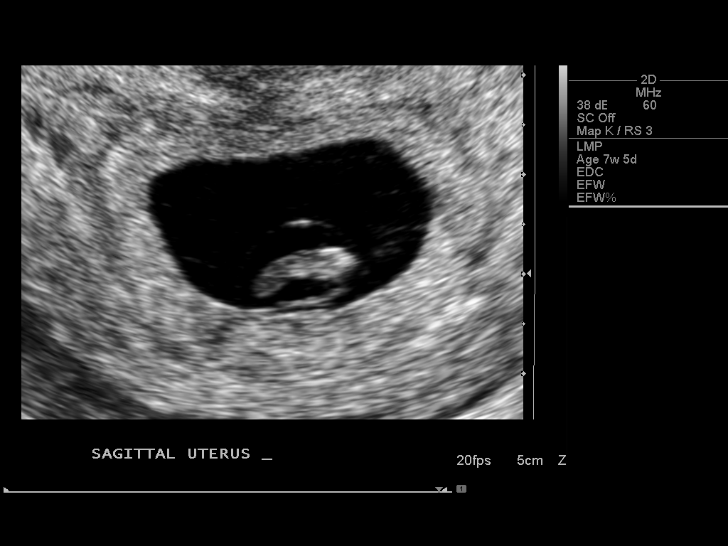
[im 32/42]
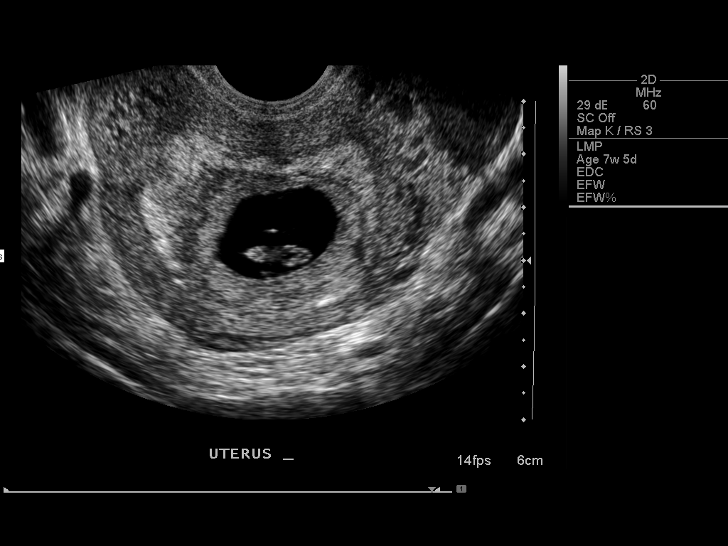
[im 35/42]
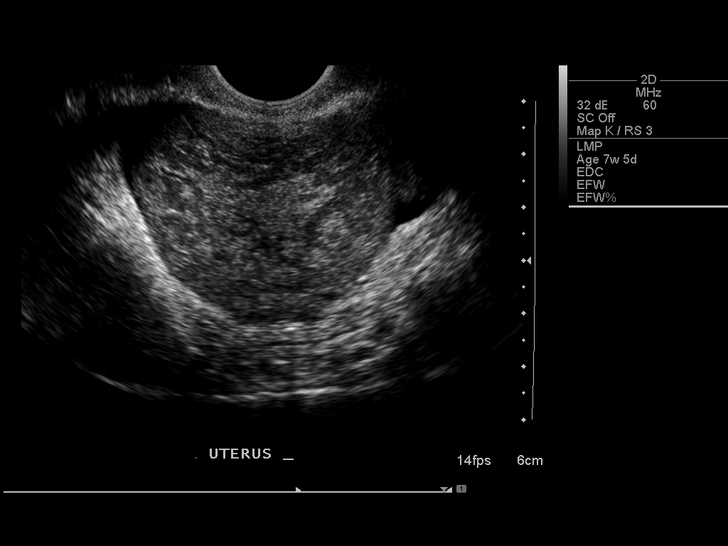
[im 38/42]
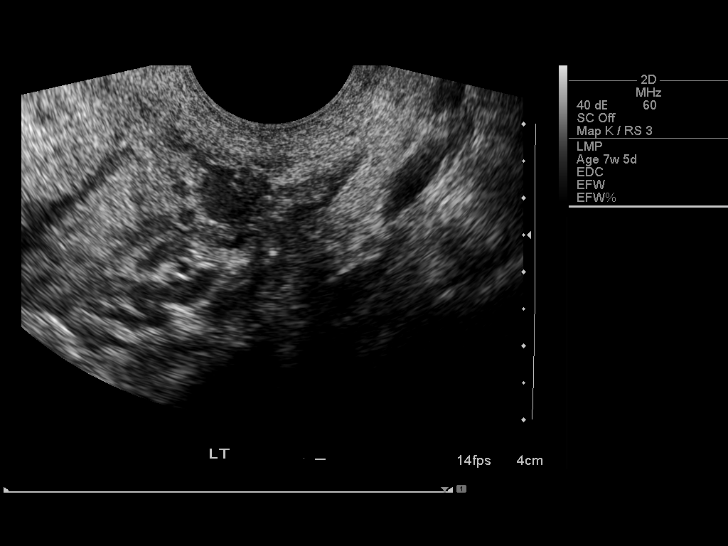
[im 42/42]
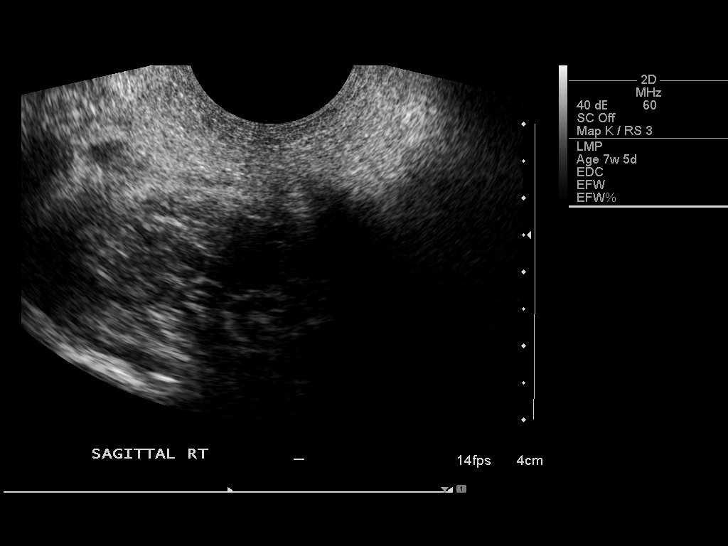

[14 of 28 positions shown; findings below may reference images not displayed]

Intrauterine gestational sac: Visualized/normal in shape.
Yolk sac: Identified
Embryo: Identified
Cardiac Activity: Identified
Heart Rate: 152 bpm

CRL: 11.5 mm 7 w 2 d US EDC: 10/07/2013

Maternal uterus/adnexae:
No subchorionic hemorrhage. Ovaries not seen. Trace free fluid.
IMPRESSION: Single intrauterine gestation with cardiac activity documented.
Estimated age of 7 weeks 2 days by crown-rump length.

## 2013-10-05 ENCOUNTER — Encounter (HOSPITAL_COMMUNITY): Payer: Self-pay | Admitting: *Deleted

## 2013-10-05 ENCOUNTER — Inpatient Hospital Stay (HOSPITAL_COMMUNITY): Payer: Medicaid Other | Admitting: Anesthesiology

## 2013-10-05 ENCOUNTER — Inpatient Hospital Stay (HOSPITAL_COMMUNITY)
Admission: AD | Admit: 2013-10-05 | Discharge: 2013-10-05 | Disposition: A | Discharge status: Home / Self Care | Payer: Medicaid Other | Source: Ambulatory Visit | Attending: Obstetrics and Gynecology | Admitting: Obstetrics and Gynecology

## 2013-10-05 ENCOUNTER — Encounter (HOSPITAL_COMMUNITY): Payer: Self-pay

## 2013-10-05 ENCOUNTER — Encounter (HOSPITAL_COMMUNITY): Payer: Medicaid Other | Admitting: Anesthesiology

## 2013-10-05 ENCOUNTER — Inpatient Hospital Stay (HOSPITAL_COMMUNITY)
Admission: AD | Admit: 2013-10-05 | Discharge: 2013-10-07 | DRG: 775 | Disposition: A | Discharge status: Home / Self Care | Payer: Medicaid Other | Source: Ambulatory Visit | Attending: Obstetrics and Gynecology | Admitting: Obstetrics and Gynecology

## 2013-10-05 DIAGNOSIS — Z22330 Carrier of Group B streptococcus: Secondary | ICD-10-CM

## 2013-10-05 DIAGNOSIS — O99892 Other specified diseases and conditions complicating childbirth: Principal | ICD-10-CM | POA: Diagnosis present

## 2013-10-05 DIAGNOSIS — O479 False labor, unspecified: Secondary | ICD-10-CM | POA: Insufficient documentation

## 2013-10-05 DIAGNOSIS — O9989 Other specified diseases and conditions complicating pregnancy, childbirth and the puerperium: Principal | ICD-10-CM

## 2013-10-05 DIAGNOSIS — Z3490 Encounter for supervision of normal pregnancy, unspecified, unspecified trimester: Secondary | ICD-10-CM

## 2013-10-05 LAB — RPR: RPR Ser Ql: NONREACTIVE

## 2013-10-05 LAB — CBC
HCT: 33.3 % — ABNORMAL LOW (ref 36.0–46.0)
Hemoglobin: 11.3 g/dL — ABNORMAL LOW (ref 12.0–15.0)
MCH: 26.3 pg (ref 26.0–34.0)
MCHC: 33.9 g/dL (ref 30.0–36.0)
MCV: 77.4 fL — ABNORMAL LOW (ref 78.0–100.0)
Platelets: 188 10*3/uL (ref 150–400)
RBC: 4.3 MIL/uL (ref 3.87–5.11)
RDW: 14.3 % (ref 11.5–15.5)
WBC: 11.8 10*3/uL — AB (ref 4.0–10.5)

## 2013-10-05 LAB — OB RESULTS CONSOLE GBS: STREP GROUP B AG: POSITIVE

## 2013-10-05 MED ORDER — ONDANSETRON HCL 4 MG/2ML IJ SOLN
4.0000 mg | Freq: Four times a day (QID) | INTRAMUSCULAR | Status: DC | PRN
  Administered 2013-10-05: 4 mg via INTRAVENOUS
  Filled 2013-10-05: qty 2

## 2013-10-05 MED ORDER — FENTANYL 2.5 MCG/ML BUPIVACAINE 1/10 % EPIDURAL INFUSION (WH - ANES)
14.0000 mL/h | INTRAMUSCULAR | Status: DC | PRN
  Administered 2013-10-05: 14 mL/h via EPIDURAL
  Filled 2013-10-05 (×2): qty 125

## 2013-10-05 MED ORDER — IBUPROFEN 600 MG PO TABS
600.0000 mg | ORAL_TABLET | Freq: Four times a day (QID) | ORAL | Status: DC | PRN
Start: 2013-10-05 — End: 2013-10-06
  Administered 2013-10-05: 600 mg via ORAL
  Filled 2013-10-05 (×2): qty 1

## 2013-10-05 MED ORDER — TERBUTALINE SULFATE 1 MG/ML IJ SOLN: 0.2500 mg | Freq: Once | INTRAMUSCULAR | Status: AC | PRN

## 2013-10-05 MED ORDER — FENTANYL 2.5 MCG/ML BUPIVACAINE 1/10 % EPIDURAL INFUSION (WH - ANES)
INTRAMUSCULAR | Status: DC | PRN
  Administered 2013-10-05: 14 mL/h via EPIDURAL

## 2013-10-05 MED ORDER — ACETAMINOPHEN 325 MG PO TABS: 650.0000 mg | ORAL_TABLET | ORAL | Status: DC | PRN

## 2013-10-05 MED ORDER — CITRIC ACID-SODIUM CITRATE 334-500 MG/5ML PO SOLN: 30.0000 mL | ORAL | Status: DC | PRN

## 2013-10-05 MED ORDER — BUTORPHANOL TARTRATE 1 MG/ML IJ SOLN
1.0000 mg | INTRAMUSCULAR | Status: DC | PRN
  Administered 2013-10-05: 1 mg via INTRAVENOUS
  Filled 2013-10-05: qty 1

## 2013-10-05 MED ORDER — OXYTOCIN BOLUS FROM INFUSION
500.0000 mL | INTRAVENOUS | Status: DC
  Administered 2013-10-05: 500 mL via INTRAVENOUS

## 2013-10-05 MED ORDER — OXYCODONE-ACETAMINOPHEN 5-325 MG PO TABS: 1.0000 | ORAL_TABLET | ORAL | Status: DC | PRN

## 2013-10-05 MED ORDER — LACTATED RINGERS IV SOLN: 500.0000 mL | INTRAVENOUS | Status: DC | PRN

## 2013-10-05 MED ORDER — EPHEDRINE 5 MG/ML INJ
10.0000 mg | INTRAVENOUS | Status: DC | PRN
  Filled 2013-10-05: qty 4
  Filled 2013-10-05: qty 2

## 2013-10-05 MED ORDER — PENICILLIN G POTASSIUM 5000000 UNITS IJ SOLR
5.0000 10*6.[IU] | Freq: Once | INTRAVENOUS | Status: AC
  Administered 2013-10-05: 5 10*6.[IU] via INTRAVENOUS
  Filled 2013-10-05: qty 5

## 2013-10-05 MED ORDER — PHENYLEPHRINE 40 MCG/ML (10ML) SYRINGE FOR IV PUSH (FOR BLOOD PRESSURE SUPPORT)
80.0000 ug | PREFILLED_SYRINGE | INTRAVENOUS | Status: DC | PRN
  Filled 2013-10-05: qty 2
  Filled 2013-10-05: qty 10

## 2013-10-05 MED ORDER — LIDOCAINE HCL (PF) 1 % IJ SOLN
INTRAMUSCULAR | Status: DC | PRN
  Administered 2013-10-05 (×2): 9 mL

## 2013-10-05 MED ORDER — LACTATED RINGERS IV SOLN
500.0000 mL | Freq: Once | INTRAVENOUS | Status: AC
  Administered 2013-10-05: 500 mL via INTRAVENOUS

## 2013-10-05 MED ORDER — LACTATED RINGERS IV SOLN
INTRAVENOUS | Status: DC
  Administered 2013-10-05 (×2): via INTRAVENOUS

## 2013-10-05 MED ORDER — DIPHENHYDRAMINE HCL 50 MG/ML IJ SOLN: 12.5000 mg | INTRAMUSCULAR | Status: DC | PRN

## 2013-10-05 MED ORDER — EPHEDRINE 5 MG/ML INJ
10.0000 mg | INTRAVENOUS | Status: DC | PRN
  Filled 2013-10-05: qty 2

## 2013-10-05 MED ORDER — PENICILLIN G POTASSIUM 5000000 UNITS IJ SOLR
2.5000 10*6.[IU] | INTRAVENOUS | Status: DC
  Administered 2013-10-05 (×2): 2.5 10*6.[IU] via INTRAVENOUS
  Filled 2013-10-05 (×3): qty 2.5

## 2013-10-05 MED ORDER — OXYTOCIN 40 UNITS IN LACTATED RINGERS INFUSION - SIMPLE MED
62.5000 mL/h | INTRAVENOUS | Status: DC
  Administered 2013-10-05: 62.5 mL/h via INTRAVENOUS

## 2013-10-05 MED ORDER — OXYTOCIN 40 UNITS IN LACTATED RINGERS INFUSION - SIMPLE MED
1.0000 m[IU]/min | INTRAVENOUS | Status: DC
  Administered 2013-10-05: 2 m[IU]/min via INTRAVENOUS
  Filled 2013-10-05: qty 1000

## 2013-10-05 MED ORDER — PHENYLEPHRINE 40 MCG/ML (10ML) SYRINGE FOR IV PUSH (FOR BLOOD PRESSURE SUPPORT)
80.0000 ug | PREFILLED_SYRINGE | INTRAVENOUS | Status: DC | PRN
  Filled 2013-10-05: qty 2

## 2013-10-05 MED ORDER — LIDOCAINE HCL (PF) 1 % IJ SOLN
30.0000 mL | INTRAMUSCULAR | Status: DC | PRN
  Filled 2013-10-05 (×2): qty 30

## 2013-10-05 NOTE — Anesthesia Preprocedure Evaluation (Signed)
Anesthesia Evaluation  Patient identified by MRN, date of birth, ID band Patient awake    Reviewed: Allergy & Precautions, H&P , NPO status , Patient's Chart, lab work & pertinent test results  Airway Mallampati: II TM Distance: >3 FB Neck ROM: full    Dental no notable dental hx.    Pulmonary    Pulmonary exam normal       Cardiovascular negative cardio ROS      Neuro/Psych negative neurological ROS  negative psych ROS   GI/Hepatic negative GI ROS, Neg liver ROS,   Endo/Other  negative endocrine ROS  Renal/GU negative Renal ROS  negative genitourinary   Musculoskeletal negative musculoskeletal ROS (+)   Abdominal Normal abdominal exam  (+)   Peds  Hematology negative hematology ROS (+)   Anesthesia Other Findings   Reproductive/Obstetrics (+) Pregnancy                           Anesthesia Physical Anesthesia Plan  ASA: II  Anesthesia Plan: Epidural   Post-op Pain Management:    Induction:   Airway Management Planned:   Additional Equipment:   Intra-op Plan:   Post-operative Plan:   Informed Consent: I have reviewed the patients History and Physical, chart, labs and discussed the procedure including the risks, benefits and alternatives for the proposed anesthesia with the patient or authorized representative who has indicated his/her understanding and acceptance.     Plan Discussed with:   Anesthesia Plan Comments:         Anesthesia Quick Evaluation

## 2013-10-05 NOTE — Anesthesia Procedure Notes (Signed)
Epidural Patient location during procedure: OB Start time: 10/05/2013 2:59 PM End time: 10/05/2013 3:03 PM  Staffing Anesthesiologist: Leilani AbleHATCHETT, Rajni Holsworth Performed by: anesthesiologist   Preanesthetic Checklist Completed: patient identified, surgical consent, pre-op evaluation, timeout performed, IV checked, risks and benefits discussed and monitors and equipment checked  Epidural Patient position: sitting Prep: site prepped and draped and DuraPrep Patient monitoring: continuous pulse ox and blood pressure Approach: midline Injection technique: LOR air  Needle:  Needle type: Tuohy  Needle gauge: 17 G Needle length: 9 cm and 9 Needle insertion depth: 5 cm cm Catheter type: closed end flexible Catheter size: 19 Gauge Catheter at skin depth: 10 cm Test dose: negative and Other  Assessment Sensory level: T9 Events: blood not aspirated, injection not painful, no injection resistance, negative IV test and no paresthesia  Additional Notes Reason for block:procedure for pain

## 2013-10-05 NOTE — Discharge Instructions (Signed)
Keep your scheduled appointment in the office. Drink 8-10 glasses of water per day.

## 2013-10-05 NOTE — Progress Notes (Signed)
Patient ID: Emily Oconnor, female   DOB: 1990-12-10, 23 y.o.   MRN: [id number] Pt sent to MAU and got into L&D about 2pm.  She got  Increasingly uncomfortable and received her epidural. She has progressed to 4cm on her own, but no change in last hour.  PCN on board for +GBS Cervix 100/4-5/-1 AROM light meconium IUPC placed for pitocin augmentation Will follow progress

## 2013-10-05 NOTE — MAU Note (Signed)
Contractions every .

## 2013-10-05 NOTE — H&P (Signed)
Emily Oconnor is a 23 y.o. female G2P0010 at 3639 6/7 weeks (EDD 10/06/13 by 8 week US) presenting for painful contractions and cervical change from 1 to 3 cm.  Pt seen in MAU eaarlier today and cervix 1cm, went home with worsening of contractions and cervix now 90/3cm.  Prenatal care significant for +GBS status.  She had a prior 15 week loss, with passing the baby at home.  She is admitted for pain control.  Maternal Medical History:  Reason for admission: Contractions.   Contractions: Onset was 6-12 hours ago.   Frequency: regular.   Perceived severity is strong.    Fetal activity: Perceived fetal activity is normal.    Prenatal Complications - Diabetes: none.    OB History   Grav Para Term Preterm Abortions TAB SAB Ect Mult Living   2    1  1       15  week fetal loss   Past Medical History  Diagnosis Date  . Asthma    Past Surgical History  Procedure Laterality Date  . No past surgeries     Family History: family history includes Cancer in her mother; Diabetes in her mother; Hypertension in her mother. Social History:  reports that she has never smoked. She has never used smokeless tobacco. She reports that she does not drink alcohol or use illicit drugs.   Prenatal Transfer Tool  Maternal Diabetes: No Genetic Screening: Normal Maternal Ultrasounds/Referrals: Normal Fetal Ultrasounds or other Referrals:  None Maternal Substance Abuse:  No Significant Maternal Medications:  None Significant Maternal Lab Results:  Lab values include: Group B Strep positive Other Comments:  None  Review of Systems  Gastrointestinal: Positive for abdominal pain.      Last menstrual period 12/29/2011. Maternal Exam:  Uterine Assessment: Contraction strength is firm.  Contraction frequency is regular.   Abdomen: Patient reports no abdominal tenderness. Fetal presentation: vertex  Introitus: Normal vulva. Normal vagina.    Physical Exam  Constitutional: She is oriented to person,  place, and time. She appears well-developed and well-nourished.  Cardiovascular: Normal rate and regular rhythm.   Respiratory: Effort normal.  GI: Soft.  Genitourinary: Vagina normal and uterus normal.  Neurological: She is alert and oriented to person, place, and time.  Psychiatric: Her behavior is normal.    Prenatal labs: ABO, Rh:  B positive Antibody:  negative Rubella:   Immune RPR:   NR HBsAg:   Neg HIV:   NR GBS:   Positve One hour GTT 114 First trimester screen and AFP WNL CF negative  Assessment/Plan: Pt admitted for early labor and cervical change.  Epidural when able (awaiting bed in L&D) GBS +, start PCN.    Oliver PilaRICHARDSON,Emily Oconnor 10/05/2013, 12:33 PM

## 2013-10-06 LAB — CBC
HEMATOCRIT: 31 % — AB (ref 36.0–46.0)
Hemoglobin: 10.3 g/dL — ABNORMAL LOW (ref 12.0–15.0)
MCH: 25.9 pg — ABNORMAL LOW (ref 26.0–34.0)
MCHC: 33.2 g/dL (ref 30.0–36.0)
MCV: 78.1 fL (ref 78.0–100.0)
PLATELETS: 184 10*3/uL (ref 150–400)
RBC: 3.97 MIL/uL (ref 3.87–5.11)
RDW: 14.4 % (ref 11.5–15.5)
WBC: 11.9 10*3/uL — ABNORMAL HIGH (ref 4.0–10.5)

## 2013-10-06 MED ORDER — DIPHENHYDRAMINE HCL 25 MG PO CAPS: 25.0000 mg | ORAL_CAPSULE | Freq: Four times a day (QID) | ORAL | Status: DC | PRN

## 2013-10-06 MED ORDER — TETANUS-DIPHTH-ACELL PERTUSSIS 5-2.5-18.5 LF-MCG/0.5 IM SUSP: 0.5000 mL | Freq: Once | INTRAMUSCULAR | Status: DC

## 2013-10-06 MED ORDER — ONDANSETRON HCL 4 MG PO TABS: 4.0000 mg | ORAL_TABLET | ORAL | Status: DC | PRN

## 2013-10-06 MED ORDER — SIMETHICONE 80 MG PO CHEW: 80.0000 mg | CHEWABLE_TABLET | ORAL | Status: DC | PRN

## 2013-10-06 MED ORDER — ZOLPIDEM TARTRATE 5 MG PO TABS: 5.0000 mg | ORAL_TABLET | Freq: Every evening | ORAL | Status: DC | PRN

## 2013-10-06 MED ORDER — DIBUCAINE 1 % RE OINT: 1.0000 "application " | TOPICAL_OINTMENT | RECTAL | Status: DC | PRN

## 2013-10-06 MED ORDER — BENZOCAINE-MENTHOL 20-0.5 % EX AERO
1.0000 "application " | INHALATION_SPRAY | CUTANEOUS | Status: DC | PRN
  Filled 2013-10-06: qty 56

## 2013-10-06 MED ORDER — IBUPROFEN 600 MG PO TABS
600.0000 mg | ORAL_TABLET | Freq: Four times a day (QID) | ORAL | Status: DC
  Administered 2013-10-06 – 2013-10-07 (×5): 600 mg via ORAL
  Filled 2013-10-06 (×6): qty 1

## 2013-10-06 MED ORDER — SENNOSIDES-DOCUSATE SODIUM 8.6-50 MG PO TABS
2.0000 | ORAL_TABLET | ORAL | Status: DC
  Administered 2013-10-07: 2 via ORAL
  Filled 2013-10-06: qty 2

## 2013-10-06 MED ORDER — LANOLIN HYDROUS EX OINT: TOPICAL_OINTMENT | CUTANEOUS | Status: DC | PRN

## 2013-10-06 MED ORDER — WITCH HAZEL-GLYCERIN EX PADS
1.0000 | MEDICATED_PAD | CUTANEOUS | Status: DC | PRN
Start: 2013-10-06 — End: 2013-10-07

## 2013-10-06 MED ORDER — ONDANSETRON HCL 4 MG/2ML IJ SOLN: 4.0000 mg | INTRAMUSCULAR | Status: DC | PRN

## 2013-10-06 MED ORDER — OXYCODONE-ACETAMINOPHEN 5-325 MG PO TABS: 1.0000 | ORAL_TABLET | ORAL | Status: DC | PRN

## 2013-10-06 MED ORDER — PRENATAL MULTIVITAMIN CH
1.0000 | ORAL_TABLET | Freq: Every day | ORAL | Status: DC
  Administered 2013-10-06 – 2013-10-07 (×2): 1 via ORAL
  Filled 2013-10-06 (×2): qty 1

## 2013-10-06 NOTE — Anesthesia Postprocedure Evaluation (Signed)
Anesthesia Post Note  Patient: Emily Oconnor  Procedure(s) Performed: * No procedures listed *  Anesthesia type: Epidural  Patient location: Mother/Baby  Post pain: Pain level controlled  Post assessment: Post-op Vital signs reviewed  Last Vitals:  Filed Vitals:   10/06/13 0600  BP: 120/70  Pulse: 89  Temp: 36.6 C  Resp: 16    Post vital signs: Reviewed  Level of consciousness:alert  Complications: No apparent anesthesia complications

## 2013-10-06 NOTE — Discharge Summary (Signed)
Obstetric Discharge Summary Reason for Admission: onset of labor Prenatal Procedures: none Intrapartum Procedures: spontaneous vaginal delivery Postpartum Procedures: none Complications-Operative and Postpartum: vaginal abrasions Hemoglobin  Date Value Range Status  10/06/2013 10.3* 12.0 - 15.0 g/dL Final     HCT  Date Value Range Status  10/06/2013 31.0* 36.0 - 46.0 % Final    Physical Exam:  General: alert and cooperative Lochia: appropriate Uterine Fundus: firm   Discharge Diagnoses: Term Pregnancy-delivered  Discharge Information: Date: 10/07/2013 Activity: pelvic rest Diet: routine Medications: Ibuprofen Condition: improved Instructions: refer to practice specific booklet Discharge to: home Follow-up Information   Follow up with Oliver PilaICHARDSON,Jacoria Keiffer W, MD In 6 weeks. (postpartum)    Specialty:  Obstetrics and Gynecology   Contact information:   [address], [address] [place] [id number] [phone]       Newborn Data: Live born female  Birth Weight: 6 lb 1.5 oz (2765 g) APGAR: 8, 9  Home with mother.  Oliver PilaICHARDSON,Jeraldean Wechter W 10/07/2013, 8:24 AM
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
## 2013-10-06 NOTE — Progress Notes (Signed)
UR chart review completed.  

## 2013-10-06 NOTE — Progress Notes (Signed)
Post Partum Day 1 Subjective: no complaints, up ad lib and tolerating PO  Objective: Blood pressure 120/70, pulse 89, temperature 97.9 F (36.6 C), temperature source Oral, resp. rate 16, height 5\' 6"  (1.676 m), weight 97.07 kg (214 lb), last menstrual period 12/29/2011, unknown if currently breastfeeding.  Physical Exam:  General: alert and cooperative Lochia: appropriate Uterine Fundus: firm    Recent Labs  10/05/13 1306 10/06/13 0540  HGB 11.3* 10.3*  HCT 33.3* 31.0*    Assessment/Plan: Plan for discharge tomorrow   LOS: 1 day   Jaren Kearn W 10/06/2013, 7:41 AM

## 2013-10-07 ENCOUNTER — Ambulatory Visit: Payer: Self-pay

## 2013-10-07 MED ORDER — IBUPROFEN 600 MG PO TABS: 600.0000 mg | ORAL_TABLET | Freq: Four times a day (QID) | ORAL | Status: AC

## 2013-10-07 NOTE — Lactation Note (Signed)
This note was copied from the chart of Emily Oconnor. Lactation Consultation Note  Follow up at 46 hours,mom requesting help with latch.  Mom alone in room requesting help with set up for syringe feeding with nipple shield. Baby latched well with nipple shield preloaded with formula. Hand expression does not yield colostrum.  Syringe fed 12 mls of formula with breastfeeding.  Baby maintained latch for about 10 minutes with a break to burp in between baby self unlatched.  Encouraged mom she is doing well.  Mom to pump following this feeding.  Mom to call for assist as needed.   Patient Name: Emily Dennard NipShamira Zentner ZOXWR'UToday's Date: 10/07/2013 Reason for consult: Follow-up assessment   Maternal Data Does the patient have breastfeeding experience prior to this delivery?: No  Feeding Feeding Type: Breast Fed Length of feed: 10 min  LATCH Score/Interventions Latch: Grasps breast easily, tongue down, lips flanged, rhythmical sucking. Intervention(s): Adjust position;Assist with latch;Breast compression  Audible Swallowing: Spontaneous and intermittent Intervention(s): Skin to skin;Hand expression  Type of Nipple: Flat Intervention(s): Hand pump  Comfort (Breast/Nipple): Filling, red/small blisters or bruises, mild/mod discomfort  Problem noted: Mild/Moderate discomfort Interventions (Mild/moderate discomfort): Post-pump;Comfort gels;Breast shields  Hold (Positioning): Assistance needed to correctly position infant at breast and maintain latch. Intervention(s): Position options;Support Pillows;Breastfeeding basics reviewed  LATCH Score: 7  Lactation Tools Discussed/Used Tools: Nipple Shields Nipple shield size: 24 Pump Review: Setup, frequency, and cleaning Initiated by:: Franz DellJana Shoptaw RN Date initiated:: 10/08/13   Consult Status Consult Status: Follow-up Date: 10/08/13 Follow-up type: In-patient    Jannifer RodneyShoptaw, Jana Lynn 10/07/2013, 8:41 PM

## 2013-10-07 NOTE — Progress Notes (Signed)
Post Partum Day 2 Subjective: no complaints and tolerating PO  Objective: Blood pressure 122/75, pulse 95, temperature 97.5 F (36.4 C), temperature source Oral, resp. rate 17, height 5\' 6"  (1.676 m), weight 97.07 kg (214 lb), last menstrual period 12/29/2011, SpO2 96.00%, unknown if currently breastfeeding.  Physical Exam:  General: alert and cooperative Lochia: appropriate Uterine Fundus: firm    Recent Labs  10/05/13 1306 10/06/13 0540  HGB 11.3* 10.3*  HCT 33.3* 31.0*    Assessment/Plan: Discharge home   LOS: 2 days   Adriena Manfre W 10/07/2013, 8:23 AM

## 2013-10-07 NOTE — Lactation Note (Signed)
This note was copied from the chart of Emily Tanai Housholder. Lactation Consultation Note Follow up visit at 42 hours of age.  Baby will become a baby patient to continue to work on breastfeeding.  Mom complains of right nipple pain with small blister.  Comfort gels given.  Hand expression attempted for several minutes without any colostrum visible. Mom is using #24 nipple shield on left breast with Gerber formula preloaded in shield.  Baby latches well with wide flanged lips.  Encouraged mom to keep a close deep latch.  Demonstrated with FOB how to syringe feed with nipple shield while baby is latched.  Baby took 12 mls formula with about 20 minutes of breastfeeding.  Basics reviewed with mom including hand expression, skin to skin, feeding cues and frequency.  Assisted with set up of mom's personal Medela pump and set up a hospital DEBP to use after feedings during her stay.  Right nipple larger and more flat than left using a 27mm shield for pumping.  Mom feels good about  Breastfeeding plan and will call for assist as needed.   Patient Name: Emily Oconnor NipShamira Chipman VHQIO'NToday's Date: 10/07/2013 Reason for consult: Follow-up assessment;Breast/nipple pain;Difficult latch;Infant < 6lbs   Maternal Data Does the patient have breastfeeding experience prior to this delivery?: No  Feeding Feeding Type: Breast Fed Length of feed: 20 min  LATCH Score/Interventions Latch: Grasps breast easily, tongue down, lips flanged, rhythmical sucking. Intervention(s): Adjust position;Assist with latch;Breast compression  Audible Swallowing: A few with stimulation (Formula in syringe, no colostrum visible) Intervention(s): Skin to skin;Hand expression  Type of Nipple: Flat Intervention(s): Hand pump  Comfort (Breast/Nipple): Filling, red/small blisters or bruises, mild/mod discomfort  Problem noted: Mild/Moderate discomfort;Cracked, bleeding, blisters, bruises Interventions (Mild/moderate discomfort):  Post-pump;Comfort gels;Breast shields  Hold (Positioning): Assistance needed to correctly position infant at breast and maintain latch. Intervention(s): Skin to skin;Support Pillows;Breastfeeding basics reviewed  LATCH Score: 6  Lactation Tools Discussed/Used Tools: Nipple Shields Nipple shield size: 24 Pump Review: Setup, frequency, and cleaning Initiated by:: Franz DellJana Lyvonne Cassell RN Date initiated:: 10/08/13   Consult Status Consult Status: Follow-up Date: 10/08/13 Follow-up type: In-patient    Jannifer RodneyShoptaw, Raijon Lindfors Lynn 10/07/2013, 5:48 PM

## 2013-10-08 ENCOUNTER — Ambulatory Visit: Payer: Self-pay

## 2013-10-08 NOTE — Lactation Note (Signed)
This note was copied from the chart of Emily Prakriti Flakes. Lactation Consultation Note: follow up visit with mom before DC. She has given bottles of formula through the night because baby was very fussy and she felt like she wasn't getting enough from her. Baby had 30 cc's of formula about 2 hours ago. Mom reports that breasts are feeling much heavier today. Mom pumped with our pump and did not obtain any milk. Used hand expression and still did not get any. Mom has borrowed Medela pump so we tried it to make sure it was working. Mom reports pain even on lowest setting. [facility]  [facility] but she does not money for that. Baby awakening, diaper changed and attempted to latch. Did not use NS and baby did take a few sucks on and off. Mom reports tugging, no pain. Then baby off to sleep. Encouraged to always nurse baby first, without NS if she can get her to latch , to use NS if she can't get her to latch,the lastly bottle feed EBM or formula if she is still hungry. To pump if baby does not nurse, to prevent engorgement. Has manual pump in room also. No questions at present.. OP appointment with us on Thursday. To call prn  Patient Name: Emily Oconnor ZOXWR'UToday's Date: 10/08/2013 Reason for consult: Follow-up assessment   Maternal Data    Feeding Feeding Type: Breast Fed Length of feed: 5 min  LATCH Score/Interventions Latch: Grasps breast easily, tongue down, lips flanged, rhythmical sucking.  Audible Swallowing: A few with stimulation  Type of Nipple: Everted at rest and after stimulation  Comfort (Breast/Nipple): Filling, red/small blisters or bruises, mild/mod discomfort  Problem noted: Mild/Moderate discomfort Interventions (Mild/moderate discomfort): Comfort gels  Hold (Positioning): Assistance needed to correctly position infant at breast and maintain latch. Intervention(s): Breastfeeding basics reviewed;Support  Pillows  LATCH Score: 7  Lactation Tools Discussed/Used     Consult Status Consult Status: Follow-up Date: 10/13/13 Follow-up type: Out-patient    Pamelia HoitWeeks, Chevy Sweigert D 10/08/2013, 11:41 AM

## 2014-03-22 ENCOUNTER — Encounter: Payer: Self-pay | Admitting: Family Medicine

## 2014-03-22 ENCOUNTER — Ambulatory Visit (INDEPENDENT_AMBULATORY_CARE_PROVIDER_SITE_OTHER): Payer: Medicaid Other | Admitting: Family Medicine

## 2014-03-22 ENCOUNTER — Other Ambulatory Visit (HOSPITAL_COMMUNITY)
Admission: RE | Admit: 2014-03-22 | Discharge: 2014-03-22 | Disposition: A | Discharge status: Home / Self Care | Payer: Medicaid Other | Source: Ambulatory Visit | Attending: Family Medicine | Admitting: Family Medicine

## 2014-03-22 VITALS — BP 116/72 | HR 67 | Ht 66.75 in | Wt 178.0 lb

## 2014-03-22 DIAGNOSIS — Z113 Encounter for screening for infections with a predominantly sexual mode of transmission: Secondary | ICD-10-CM | POA: Insufficient documentation

## 2014-03-22 DIAGNOSIS — N898 Other specified noninflammatory disorders of vagina: Secondary | ICD-10-CM

## 2014-03-22 LAB — POCT WET PREP (WET MOUNT)
Clue Cells Wet Prep Whiff POC: NEGATIVE
WBC, Wet Prep HPF POC: >20

## 2014-03-22 NOTE — Progress Notes (Signed)
Patient ID: Emily Oconnor, female   DOB: Dec 14, 1990, 23 y.o.   MRN: [id number] Subjective:   CC: Patient presents today to establish care. Additionally, patient would like to discuss vaginal discharge.  HPI:   Vaginal discharge Patient has had vaginal discharge with itching for about 10 days. She  denies fevers or chills, abdominal pain, or dyspareunia. She tried Monistat at onset of symptoms but this did not help. Her last menstrual period has not been regular since her Mirena but was around June 4.   Review of Systems - Per HPI.  PMH: [facility] was last seen March 2050 Birth control: Mirena placed February 2015 at six-week followup OB: G2 P1011 miscarriage at 14 weeks; living child is a 6 month healthy girl , NSVD, no complications Asthma hx No current medications NKDA Health screening: Pap smear greater than one year ago that was never abnormal  FH: Mother with alcohol/drug abuse, depression, suicide, or anxiety Father, sister, and brother with no significant history Grandparents with diabetes, high cholesterol, high blood pressure, stroke Aunts/uncles  with diabetes, high cholesterol, high blood pressure  SH: Sexually active with one female. feels safe in her relationship Not concerned for STDs. Went to some high school. Works at [employer] Is employed part-time. Lives with her 659-month-old daughter Does not exercise regularly Tobacco: Never smoker No drug use 2 alcoholic beverages per month     Objective:  Physical Exam BP 116/72  Pulse 67  Ht 5' 6.75" (1.695 m)  Wt 178 lb (80.74 kg)  BMI 28.10 kg/m2 GEN: NAD HEENT: AT/[place], sclera clear, EOMI CVL RRR, no m/r/g PULM: CTAB, normal effort Abdomen: Soft, nontender, nondistended Genitourinary: Normal vaginal mucosa and normal appearing cervix, thin white vaginal discharge, no cervical motion tenderness, uterus and adnexa within normal limits of size and mobility  with no tenderness or masses    Assessment:     Emily Oconnor is a 23 y.o. female with no significant PMH here to establish care and discuss vaginal discharge.    Plan:     Vaginal discharge Mild discharge seen, no abdominal tenderness or cervical motion tenderness. Afebrile. -Wet prep -GC chlamydia  # Health Maintenance:  - Patient to make an appointment within the next 1-2 months for health maintenance exam - She is unsure when her last Pap smear was. We'll get a release of information from [facility]  Follow-up: Follow up in one to 2 months when convenient  for health maintenance visit.   Leona SingletonMaria T Xaria Judon, MD [facility]

## 2014-03-23 ENCOUNTER — Encounter: Payer: Self-pay | Admitting: Family Medicine

## 2014-03-23 DIAGNOSIS — N898 Other specified noninflammatory disorders of vagina: Secondary | ICD-10-CM | POA: Insufficient documentation

## 2014-03-23 NOTE — Assessment & Plan Note (Signed)
Mild discharge seen, no abdominal tenderness or cervical motion tenderness. Afebrile. -Wet prep -GC chlamydia

## 2014-04-19 ENCOUNTER — Ambulatory Visit (INDEPENDENT_AMBULATORY_CARE_PROVIDER_SITE_OTHER): Payer: Medicaid Other | Admitting: Family Medicine

## 2014-04-19 ENCOUNTER — Encounter: Payer: Self-pay | Admitting: Family Medicine

## 2014-04-19 VITALS — BP 138/57 | HR 98 | Temp 97.9°F | Wt 179.0 lb

## 2014-04-19 DIAGNOSIS — R05 Cough: Secondary | ICD-10-CM | POA: Insufficient documentation

## 2014-04-19 DIAGNOSIS — R059 Cough, unspecified: Secondary | ICD-10-CM

## 2014-04-19 DIAGNOSIS — N898 Other specified noninflammatory disorders of vagina: Secondary | ICD-10-CM

## 2014-04-19 MED ORDER — METRONIDAZOLE 500 MG PO TABS: 500.0000 mg | ORAL_TABLET | Freq: Two times a day (BID) | ORAL | Status: AC

## 2014-04-19 NOTE — Patient Instructions (Signed)
For your cough and cold symptoms, this sounds like a viral upper respiratory infection with some involvement of your sinuses. Rest, hydrate, and inhale humidified air in the shower or using a humidifier. You can also try a Netti pot. Seek immediate care if you develop trouble breathing, chest pain, high fevers, or other concerns. Otherwise, follow up early next week if symptoms are NOT improving.  For your persistent vaginal discharge, let us try a course of flagyl 2x daily for 7 days. Do not drink alcohol if you are taking this. It is not recommended to breastfeed while taking this.  Best,  Leona SingletonMaria T Tamica Covell, MD

## 2014-04-19 NOTE — Progress Notes (Signed)
Patient ID: Emily Oconnor, female   DOB: [birth year], 23 y.o.   MRN: [id number] Subjective:   CC: Runny nose and cough  HPI:   Emily Oconnor is a 23 y.o. female with no significant PMH here for cough and cold symptoms. She reports that for the past 6 days she has had runny nose, cough bringing up "greenish yellow" mucus, congestion, and last night started having left ear pain and a sore throat. She denies fevers, chills, face pain or pressure, nausea, vomiting, diarrhea, dyspnea, rash, or chest pain. She is able to eat and drink normally. She has tried Dayquil which helps some with symptoms. Her baby has a runny nose and is cared for by her father's wife who keeps other kids.  Review of Systems - Per HPI.  Additionally, she still has vaginal discharge with odor but no itching, pain, or other concerns. Early July wet prep negative along with GC/chlamydia. Pt states this feels like BV.  PMH:  Medications - reviewed - Dayquil PRN - Tylenol PRN NKDA  FH: Negative per pt  Smoking status: Never smoker    Objective:  Physical Exam BP 138/57  Pulse 98  Temp(Src) 97.9 F (36.6 C) (Oral)  Wt 179 lb (81.194 kg)  LMP 04/04/2014 GEN: NAD, pleasant CV: RRR, no m/r/g PULM: CTAB, normal effort, no wheezes or crackles HEENT: AT/[place], sclera clear, EOMI, PERRL, o/p clear with no exudate or erythema, TMs clear bilaterally, retracted with clear fluid behind, L>R, no sinus pain, shotty anterior cervical LAD, no posterior cervical LAD SKIN: No rash or cyanosis    Assessment:     Emily Oconnor is a 23 y.o. female with no significant PMH here for cough and cold symptoms.    Plan:     Cough and cold symptoms No fevers, chills, sinus tenderness, dyspnea, or vital sign abnormalities. Most likely viral URI with sinus component. No signs of pneumonia on pulmonary exam or bacterial sinusitis or otitis media on HEENT exam. - Reassurance.  - Rest, hydrate, handwashing, humidified air. - Try netti pot -  Return precautions reviewed.   Persistent vaginal discharge Likely BV given discharge and odor with no other symptoms. - Flagyl BID x 7 days. Recommended against alcohol and not to breastfeed while using this. - Return precautions reviewed.    Leona SingletonMaria T Ripley Lovecchio, MD [facility]

## 2014-04-19 NOTE — Assessment & Plan Note (Signed)
No fevers, chills, sinus tenderness, dyspnea, or vital sign abnormalities. Most likely viral URI with sinus component. No signs of pneumonia on pulmonary exam or bacterial sinusitis or otitis media on HEENT exam. - Reassurance.  - Rest, hydrate, handwashing, humidified air. - Try netti pot - Return precautions reviewed.

## 2014-04-19 NOTE — Assessment & Plan Note (Signed)
Likely BV given discharge and odor with no other symptoms. - Flagyl BID x 7 days. Recommended against alcohol and not to breastfeed while using this. - Return precautions reviewed.

## 2014-07-24 ENCOUNTER — Encounter: Payer: Self-pay | Admitting: Family Medicine

## 2014-09-29 ENCOUNTER — Ambulatory Visit: Payer: Medicaid Other | Admitting: Family Medicine

## 2014-11-29 ENCOUNTER — Telehealth: Payer: Self-pay | Admitting: Family Medicine

## 2014-11-29 ENCOUNTER — Encounter: Payer: Self-pay | Admitting: Family Medicine

## 2014-11-29 ENCOUNTER — Ambulatory Visit (INDEPENDENT_AMBULATORY_CARE_PROVIDER_SITE_OTHER): Payer: Medicaid Other | Admitting: Family Medicine

## 2014-11-29 ENCOUNTER — Other Ambulatory Visit (HOSPITAL_COMMUNITY)
Admission: RE | Admit: 2014-11-29 | Discharge: 2014-11-29 | Disposition: A | Discharge status: Home / Self Care | Payer: Medicaid Other | Source: Ambulatory Visit | Attending: Family Medicine | Admitting: Family Medicine

## 2014-11-29 VITALS — BP 109/69 | HR 70 | Temp 98.1°F | Ht 67.0 in | Wt 177.0 lb

## 2014-11-29 DIAGNOSIS — R102 Pelvic and perineal pain: Secondary | ICD-10-CM

## 2014-11-29 DIAGNOSIS — Z113 Encounter for screening for infections with a predominantly sexual mode of transmission: Secondary | ICD-10-CM | POA: Insufficient documentation

## 2014-11-29 DIAGNOSIS — N898 Other specified noninflammatory disorders of vagina: Secondary | ICD-10-CM

## 2014-11-29 DIAGNOSIS — N939 Abnormal uterine and vaginal bleeding, unspecified: Secondary | ICD-10-CM

## 2014-11-29 DIAGNOSIS — Z20828 Contact with and (suspected) exposure to other viral communicable diseases: Secondary | ICD-10-CM

## 2014-11-29 DIAGNOSIS — N9489 Other specified conditions associated with female genital organs and menstrual cycle: Secondary | ICD-10-CM

## 2014-11-29 DIAGNOSIS — Z202 Contact with and (suspected) exposure to infections with a predominantly sexual mode of transmission: Secondary | ICD-10-CM

## 2014-11-29 DIAGNOSIS — N920 Excessive and frequent menstruation with regular cycle: Secondary | ICD-10-CM

## 2014-11-29 DIAGNOSIS — N923 Ovulation bleeding: Secondary | ICD-10-CM | POA: Insufficient documentation

## 2014-11-29 LAB — POCT WET PREP (WET MOUNT): Clue Cells Wet Prep Whiff POC: NEGATIVE

## 2014-11-29 LAB — POCT URINE PREGNANCY

## 2014-11-29 NOTE — Patient Instructions (Addendum)
We are getting some lab work and I will call if anything is not normal. We will decide on treatment based on your lab work. I'm getting a list of other birth control options, but the Mirena is a wonderful option. It could certainly be contributing to some spotting. If your abdominal pain is not improving in 1-2 months, follow-up. Seek immediate care if you start having any severe pain that does not go away, fevers or chills, pain with urinating, or other concerns.  Best,  Leona SingletonMaria T Kallie Depolo, MD

## 2014-11-29 NOTE — Assessment & Plan Note (Signed)
Intermittent, spontaneously resolving, not present today.  - See vaginal discharge problem for A/P

## 2014-11-29 NOTE — Progress Notes (Signed)
Patient ID: Emily Oconnor, female   DOB: 1991-05-17, 24 y.o.   MRN: [id number] Subjective:   CC: Birth control review  HPI:   Patient presents for evaluation of vaginal odor and discharge x 2 month with abdominal pain and spotting x 2 months. She denies vaginal pain, fevers or chills. Last time she presented for vaginal discharge July, she was empirically prescribed flagyl which resolved symptoms. She denies vaginal itching.   She describes abdominal pain as mild cramping, occasionally sharp and severe intermittently after sex in lower abdomen/pelvis, resolving quickly and spontaneously. She denies dysuria, back pain, nausea, vomiting, or diarrhea. She is having normal daily BMs. Sexually active with 1 partner but interested in testing. She spots between periods 1-2 times monthly.   Has had mirena x >1 year. Wonders if it is causing symptoms and is interested in knowing more about paraguard 10 year IUD.   Review of Systems - Per HPI.   PMH - cough, vaginal discharge, NSVD    Objective:  Physical Exam BP 109/69 mmHg  Pulse 70  Temp(Src) 98.1 F (36.7 C) (Oral)  Ht 5\' 7"  (1.702 m)  Wt 177 lb (80.287 kg)  BMI 27.72 kg/m2  LMP 11/20/2014 (Approximate) GEN: NAD ABD: S/NT/ND GU: Normal appearing vagina and cervix with clear discharge and mild spotting, IUD strings in place curved around cervix, normal size and mobility of uterus and adnexae with no CMT    Assessment:     Emily Oconnor is a 24 y.o. female here for vaginal discharge, spotting, and pelvic pain.    Plan:     # See problem list and after visit summary for problem-specific plans.   # Health Maintenance: Not discussed  Follow-up: Follow up in 1-2 months if intermittent pelvic pain does not improve/resolve.   Leona SingletonMaria T Alexarae Oliva, MD [facility]

## 2014-11-29 NOTE — Assessment & Plan Note (Signed)
Malodorous discharge most likely BV but with other symptoms, pt amenable to STD testing. Afebrile with normal pelvic exam with clear discharge and no CMT. Unclear etiology for abdominal pain. Discussed that spotting could occur intermittently with mirena and paraguard could potentially increase vaginal bleeding. - Wet prep>>negative - Upreg>>invalid due to no control line on test x 2; sent serum qual hcg - GC/Chlamydia, HIV, RPR - If testing neg and abdominal intermittent sharp pain consistent x 1 mo, f/u and could consider abdominal [place]. - List of birth control options provided; pt opts to continuing mirena for now. - Rtrn precautions reviewed.

## 2014-11-29 NOTE — Telephone Encounter (Signed)
Called to let patient know wet prep was negative and upreg control failed twice so switched to serum qualitative hcg and would call with results at same number [phone] where she states it is okay to leave detailed VM). She voiced understanding.  Emily SingletonMaria T Fairy Ashlock, MD

## 2014-11-30 LAB — GC/CHLAMYDIA PROBE AMP (~~LOC~~) NOT AT ARMC
CHLAMYDIA, DNA PROBE: NEGATIVE
Neisseria Gonorrhea: NEGATIVE

## 2014-11-30 LAB — HIV ANTIBODY (ROUTINE TESTING W REFLEX): HIV 1&2 Ab, 4th Generation: NONREACTIVE

## 2014-11-30 LAB — RPR

## 2014-11-30 LAB — HCG, SERUM, QUALITATIVE: Preg, Serum: NEGATIVE

## 2014-11-30 NOTE — Progress Notes (Signed)
I was the preceptor on the day of this visit.   Alphus Zeck MD  

## 2014-12-01 ENCOUNTER — Telehealth: Payer: Self-pay | Admitting: Family Medicine

## 2014-12-01 DIAGNOSIS — N760 Acute vaginitis: Principal | ICD-10-CM

## 2014-12-01 DIAGNOSIS — B9689 Other specified bacterial agents as the cause of diseases classified elsewhere: Secondary | ICD-10-CM

## 2014-12-01 NOTE — Telephone Encounter (Addendum)
Left message on patient's personal cell phone number where she stated detailed message could be left that all lab work was normal (serum pregnancy, HIV, RPR, GC/chlamydia). Informed her that we could again prescribe flagyl x 1 week if malodorous vaginal discharge was bothersome, but she would need to avoid alcohol while taking this medication to avoid disulfiram-like reaction. She is to let me know if she would like me to send in flagyl.  Leona SingletonMaria T Judieth Mckown, MD

## 2014-12-04 MED ORDER — METRONIDAZOLE 500 MG PO TABS: 500.0000 mg | ORAL_TABLET | Freq: Two times a day (BID) | ORAL | Status: AC

## 2014-12-04 NOTE — Telephone Encounter (Signed)
Would like flagyl sent in  Same pharmacy

## 2014-12-04 NOTE — Addendum Note (Signed)
Addended by: Simone CuriaHEKKEKANDAM, Roselinda Bahena T on: 12/04/2014 11:50 AM   Modules accepted: Orders

## 2014-12-04 NOTE — Telephone Encounter (Signed)
I have sent in flagyl.  Emily SingletonMaria T Naythen Heikkila, MD

## 2014-12-04 NOTE — Telephone Encounter (Signed)
Will forward to PCP for review. Katina Remick, CMA. 

## 2015-04-23 ENCOUNTER — Ambulatory Visit: Payer: Medicaid Other | Admitting: Obstetrics and Gynecology

## 2015-05-02 ENCOUNTER — Ambulatory Visit (INDEPENDENT_AMBULATORY_CARE_PROVIDER_SITE_OTHER): Payer: Medicaid Other | Admitting: Obstetrics and Gynecology

## 2015-05-02 ENCOUNTER — Encounter: Payer: Self-pay | Admitting: Obstetrics and Gynecology

## 2015-05-02 ENCOUNTER — Other Ambulatory Visit (HOSPITAL_COMMUNITY)
Admission: RE | Admit: 2015-05-02 | Discharge: 2015-05-02 | Disposition: A | Discharge status: Home / Self Care | Payer: Medicaid Other | Source: Ambulatory Visit | Attending: Family Medicine | Admitting: Family Medicine

## 2015-05-02 VITALS — BP 121/67 | HR 79 | Temp 98.7°F | Ht 67.0 in | Wt 164.4 lb

## 2015-05-02 DIAGNOSIS — Z1151 Encounter for screening for human papillomavirus (HPV): Secondary | ICD-10-CM | POA: Diagnosis present

## 2015-05-02 DIAGNOSIS — Z124 Encounter for screening for malignant neoplasm of cervix: Secondary | ICD-10-CM | POA: Diagnosis not present

## 2015-05-02 DIAGNOSIS — R6889 Other general symptoms and signs: Secondary | ICD-10-CM | POA: Diagnosis not present

## 2015-05-02 DIAGNOSIS — Z202 Contact with and (suspected) exposure to infections with a predominantly sexual mode of transmission: Secondary | ICD-10-CM | POA: Diagnosis not present

## 2015-05-02 DIAGNOSIS — N898 Other specified noninflammatory disorders of vagina: Secondary | ICD-10-CM | POA: Diagnosis not present

## 2015-05-02 DIAGNOSIS — Z0001 Encounter for general adult medical examination with abnormal findings: Secondary | ICD-10-CM

## 2015-05-02 DIAGNOSIS — Z0000 Encounter for general adult medical examination without abnormal findings: Secondary | ICD-10-CM | POA: Diagnosis not present

## 2015-05-02 DIAGNOSIS — Z113 Encounter for screening for infections with a predominantly sexual mode of transmission: Secondary | ICD-10-CM | POA: Diagnosis present

## 2015-05-02 DIAGNOSIS — R87810 Cervical high risk human papillomavirus (HPV) DNA test positive: Secondary | ICD-10-CM | POA: Diagnosis present

## 2015-05-02 DIAGNOSIS — Z01411 Encounter for gynecological examination (general) (routine) with abnormal findings: Secondary | ICD-10-CM | POA: Insufficient documentation

## 2015-05-02 LAB — POCT WET PREP (WET MOUNT): CLUE CELLS WET PREP WHIFF POC: NEGATIVE

## 2015-05-02 NOTE — Assessment & Plan Note (Signed)
A: Abormnal vaginal discharge that has associated malodor and color change. Patient with h/o STD. VSS and she is afebrile. Normal pelvic exam without any pain.   P: -wet prep >> negative -STD testing ordered GC/Ch, HIV, RPR - RTC precautions reviewed

## 2015-05-02 NOTE — Assessment & Plan Note (Signed)
Routine preventative visit today. Pap smear performed. Patient otherwise up to date on all vaccines and health maintenance.

## 2015-05-02 NOTE — Patient Instructions (Addendum)
I will contact you about your results. RTC as needed or in one year for another physical  Health Maintenance Adopting a healthy lifestyle and getting preventive care can go a long way to promote health and wellness. Talk with your health care provider about what schedule of regular examinations is right for you. This is a good chance for you to check in with your provider about disease prevention and staying healthy. In between checkups, there are plenty of things you can do on your own. Experts have done a lot of research about which lifestyle changes and preventive measures are most likely to keep you healthy. Ask your health care provider for more information. WEIGHT AND DIET  Eat a healthy diet  Be sure to include plenty of vegetables, fruits, low-fat dairy products, and lean protein.  Do not eat a lot of foods high in solid fats, added sugars, or salt.  Get regular exercise. This is one of the most important things you can do for your health.  Most adults should exercise for at least 150 minutes each week. The exercise should increase your heart rate and make you sweat (moderate-intensity exercise).  Most adults should also do strengthening exercises at least twice a week. This is in addition to the moderate-intensity exercise.  Maintain a healthy weight  Body mass index (BMI) is a measurement that can be used to identify possible weight problems. It estimates body fat based on height and weight. Your health care provider can help determine your BMI and help you achieve or maintain a healthy weight.  For females 43 years of age and older:   A BMI below 18.5 is considered underweight.  A BMI of 18.5 to 24.9 is normal.  A BMI of 25 to 29.9 is considered overweight.  A BMI of 30 and above is considered obese.  Watch levels of cholesterol and blood lipids  You should start having your blood tested for lipids and cholesterol at 24 years of age, then have this test every 5  years.  You may need to have your cholesterol levels checked more often if:  Your lipid or cholesterol levels are high.  You are older than 24 years of age.  You are at high risk for heart disease.  CANCER SCREENING   Lung Cancer  Lung cancer screening is recommended for adults 16-5 years old who are at high risk for lung cancer because of a history of smoking.  A yearly low-dose CT scan of the lungs is recommended for people who:  Currently smoke.  Have quit within the past 15 years.  Have at least a 30-pack-year history of smoking. A pack year is smoking an average of one pack of cigarettes a day for 1 year.  Yearly screening should continue until it has been 15 years since you quit.  Yearly screening should stop if you develop a health problem that would prevent you from having lung cancer treatment.  Breast Cancer  Practice breast self-awareness. This means understanding how your breasts normally appear and feel.  It also means doing regular breast self-exams. Let your health care provider know about any changes, no matter how small.  If you are in your 20s or 30s, you should have a clinical breast exam (CBE) by a health care provider every 1-3 years as part of a regular health exam.  If you are 8 or older, have a CBE every year. Also consider having a breast X-ray (mammogram) every year.  If you have a  family history of breast cancer, talk to your health care provider about genetic screening.  If you are at high risk for breast cancer, talk to your health care provider about having an MRI and a mammogram every year.  Breast cancer gene (BRCA) assessment is recommended for women who have family members with BRCA-related cancers. BRCA-related cancers include:  Breast.  Ovarian.  Tubal.  Peritoneal cancers.  Results of the assessment will determine the need for genetic counseling and BRCA1 and BRCA2 testing. Cervical Cancer Routine pelvic examinations to  screen for cervical cancer are no longer recommended for nonpregnant women who are considered low risk for cancer of the pelvic organs (ovaries, uterus, and vagina) and who do not have symptoms. A pelvic examination may be necessary if you have symptoms including those associated with pelvic infections. Ask your health care provider if a screening pelvic exam is right for you.   The Pap test is the screening test for cervical cancer for women who are considered at risk.  If you had a hysterectomy for a problem that was not cancer or a condition that could lead to cancer, then you no longer need Pap tests.  If you are older than 65 years, and you have had normal Pap tests for the past 10 years, you no longer need to have Pap tests.  If you have had past treatment for cervical cancer or a condition that could lead to cancer, you need Pap tests and screening for cancer for at least 20 years after your treatment.  If you no longer get a Pap test, assess your risk factors if they change (such as having a new sexual partner). This can affect whether you should start being screened again.  Some women have medical problems that increase their chance of getting cervical cancer. If this is the case for you, your health care provider may recommend more frequent screening and Pap tests.  The human papillomavirus (HPV) test is another test that may be used for cervical cancer screening. The HPV test looks for the virus that can cause cell changes in the cervix. The cells collected during the Pap test can be tested for HPV.  The HPV test can be used to screen women 33 years of age and older. Getting tested for HPV can extend the interval between normal Pap tests from three to five years.  An HPV test also should be used to screen women of any age who have unclear Pap test results.  After 24 years of age, women should have HPV testing as often as Pap tests.  Colorectal Cancer  This type of cancer can be  detected and often prevented.  Routine colorectal cancer screening usually begins at 24 years of age and continues through 24 years of age.  Your health care provider may recommend screening at an earlier age if you have risk factors for colon cancer.  Your health care provider may also recommend using home test kits to check for hidden blood in the stool.  A small camera at the end of a tube can be used to examine your colon directly (sigmoidoscopy or colonoscopy). This is done to check for the earliest forms of colorectal cancer.  Routine screening usually begins at age 61.  Direct examination of the colon should be repeated every 5-10 years through 24 years of age. However, you may need to be screened more often if early forms of precancerous polyps or small growths are found. Skin Cancer  Check your skin  from head to toe regularly.  Tell your health care provider about any new moles or changes in moles, especially if there is a change in a mole's shape or color.  Also tell your health care provider if you have a mole that is larger than the size of a pencil eraser.  Always use sunscreen. Apply sunscreen liberally and repeatedly throughout the day.  Protect yourself by wearing long sleeves, pants, a wide-brimmed hat, and sunglasses whenever you are outside. HEART DISEASE, DIABETES, AND HIGH BLOOD PRESSURE   Have your blood pressure checked at least every 1-2 years. High blood pressure causes heart disease and increases the risk of stroke.  If you are between 87 years and 42 years old, ask your health care provider if you should take aspirin to prevent strokes.  Have regular diabetes screenings. This involves taking a blood sample to check your fasting blood sugar level.  If you are at a normal weight and have a low risk for diabetes, have this test once every three years after 24 years of age.  If you are overweight and have a high risk for diabetes, consider being tested at a  younger age or more often. PREVENTING INFECTION  Hepatitis B  If you have a higher risk for hepatitis B, you should be screened for this virus. You are considered at high risk for hepatitis B if:  You were born in a country where hepatitis B is common. Ask your health care provider which countries are considered high risk.  Your parents were born in a high-risk country, and you have not been immunized against hepatitis B (hepatitis B vaccine).  You have HIV or AIDS.  You use needles to inject street drugs.  You live with someone who has hepatitis B.  You have had sex with someone who has hepatitis B.  You get hemodialysis treatment.  You take certain medicines for conditions, including cancer, organ transplantation, and autoimmune conditions. Hepatitis C  Blood testing is recommended for:  Everyone born from [birth year] through 1965.  Anyone with known risk factors for hepatitis C. Sexually transmitted infections (STIs)  You should be screened for sexually transmitted infections (STIs) including gonorrhea and chlamydia if:  You are sexually active and are younger than 24 years of age.  You are older than 24 years of age and your health care provider tells you that you are at risk for this type of infection.  Your sexual activity has changed since you were last screened and you are at an increased risk for chlamydia or gonorrhea. Ask your health care provider if you are at risk.  If you do not have HIV, but are at risk, it may be recommended that you take a prescription medicine daily to prevent HIV infection. This is called pre-exposure prophylaxis (PrEP). You are considered at risk if:  You are sexually active and do not regularly use condoms or know the HIV status of your partner(s).  You take drugs by injection.  You are sexually active with a partner who has HIV. Talk with your health care provider about whether you are at high risk of being infected with HIV. If you choose  to begin PrEP, you should first be tested for HIV. You should then be tested every 3 months for as long as you are taking PrEP.  PREGNANCY   If you are premenopausal and you may become pregnant, ask your health care provider about preconception counseling.  If you may become pregnant, take 400 to  800 micrograms (mcg) of folic acid every day.  If you want to prevent pregnancy, talk to your health care provider about birth control (contraception). OSTEOPOROSIS AND MENOPAUSE   Osteoporosis is a disease in which the bones lose minerals and strength with aging. This can result in serious bone fractures. Your risk for osteoporosis can be identified using a bone density scan.  If you are 13 years of age or older, or if you are at risk for osteoporosis and fractures, ask your health care provider if you should be screened.  Ask your health care provider whether you should take a calcium or vitamin D supplement to lower your risk for osteoporosis.  Menopause may have certain physical symptoms and risks.  Hormone replacement therapy may reduce some of these symptoms and risks. Talk to your health care provider about whether hormone replacement therapy is right for you.  HOME CARE INSTRUCTIONS   Schedule regular health, dental, and eye exams.  Stay current with your immunizations.   Do not use any tobacco products including cigarettes, chewing tobacco, or electronic cigarettes.  If you are pregnant, do not drink alcohol.  If you are breastfeeding, limit how much and how often you drink alcohol.  Limit alcohol intake to no more than 1 drink per day for nonpregnant women. One drink equals 12 ounces of beer, 5 ounces of wine, or 1 ounces of hard liquor.  Do not use street drugs.  Do not share needles.  Ask your health care provider for help if you need support or information about quitting drugs.  Tell your health care provider if you often feel depressed.  Tell your health care  provider if you have ever been abused or do not feel safe at home. Document Released: 03/24/2011 Document Revised: 01/23/2014 Document Reviewed: 08/10/2013 [facility] Patient Information 2015 [place], [place]. This information is not intended to replace advice given to you by your health care provider. Make sure you discuss any questions you have with your health care provider.

## 2015-05-02 NOTE — Progress Notes (Signed)
     Subjective: Chief Complaint  Patient presents with  . Vaginal Discharge  . Follow-up    Mirena  . Annual Exam    HPI: Emily Oconnor is a 24 y.o. presenting to clinic today for routine preventative visit. She ahs the following concerns:  #Vaginal discharge has had vaginal discharge for the last month  Describes as yellow and cloudy in appearance States that comes and goes Endorses foul odor Denies any itching erythema or burning, denies abdominal pain, n/v She's had one partner Endorses having unprotected intercourse History of STDs in the past Would like STD testing  Sexual/Birth History: Sexually active with one partner. [id number].  Birth Control: Mirena IUD  Social:  Social History   Social History  . Marital Status: Single    Spouse Name: N/A  . Number of Children: N/A  . Years of Education: N/A   Social History Main Topics  . Smoking status: Never Smoker   . Smokeless tobacco: Never Used  . Alcohol Use: Yes  . Drug Use: No  . Sexual Activity: Yes    Birth Control/ Protection: IUD   Other Topics Concern  . None   Social History Narrative    Health Maintenance: Pap smear due   ROS in HPI. Otherwise 12 point ROS done and negative.  Past Medical, Surgical, Social, and Family History Reviewed & Updated per EMR.   Objective: BP 121/67 mmHg  Pulse 79  Temp(Src) 98.7 F (37.1 C) (Oral)  Ht  (1.702 m)  Wt 164 lb 7 oz (74.588 kg)  BMI 25.75 kg/m2  Physical Exam  Constitutional: She is oriented to person, place, and time and well-developed, well-nourished, and in no distress.  HENT:  Head: Normocephalic and atraumatic.  Mouth/Throat: Oropharynx is clear and moist.  Eyes: EOM are normal.  Neck: Normal range of motion.  Cardiovascular: Normal rate, regular rhythm, normal heart sounds and intact distal pulses.   Pulmonary/Chest: Effort normal and breath sounds normal.  Abdominal: Soft. Bowel sounds are normal. There is no tenderness.    Genitourinary: Cervix normal, right adnexa normal and left adnexa normal. Thin  odorless  clear and vaginal discharge found.  No cervical motion tenderness. IUD strings visualized.   Musculoskeletal: Normal range of motion. She exhibits no edema.  Neurological: She is alert and oriented to person, place, and time.  No focal deficits appreciated  Skin: Skin is warm and dry.  Psychiatric: Mood and affect normal.    Results for orders placed or performed in visit on 05/02/15 (from the past 72 hour(s))  POCT Wet Prep [facility])     Status: None   Collection Time: 05/02/15 10:40 AM  Result Value Ref Range   Source Wet Prep POC VAG    WBC, Wet Prep HPF POC 1-3    Bacteria Wet Prep HPF POC Few None, Few   Clue Cells Wet Prep HPF POC None None   Clue Cells Wet Prep Whiff POC Negative Whiff    Yeast Wet Prep HPF POC None    Trichomonas Wet Prep HPF POC NONE     Assessment/Plan: Please see problem based Assessment and Plan   Orders Placed This Encounter  Procedures  . HIV antibody  . RPR  . POCT Wet Prep [facility])    [place], [place] 05/02/2015, 12:17 PM PGY-2, [facility]

## 2015-05-03 LAB — CERVICOVAGINAL ANCILLARY ONLY
CHLAMYDIA, DNA PROBE: NEGATIVE
NEISSERIA GONORRHEA: NEGATIVE

## 2015-05-03 LAB — HIV ANTIBODY (ROUTINE TESTING W REFLEX): HIV: NONREACTIVE

## 2015-05-03 LAB — RPR

## 2015-05-04 LAB — CYTOLOGY - PAP

## 2015-05-07 ENCOUNTER — Encounter: Payer: Self-pay | Admitting: Obstetrics and Gynecology

## 2015-05-14 ENCOUNTER — Telehealth: Payer: Self-pay | Admitting: Obstetrics and Gynecology

## 2015-05-14 NOTE — Telephone Encounter (Signed)
Will forward to PCP for results. Please forward response back to blue pool. Joesphine Schemm, CMA.

## 2015-05-14 NOTE — Telephone Encounter (Signed)
Please call back with results/bmc

## 2015-05-15 NOTE — Telephone Encounter (Signed)
Letter sent with results on 8/15. Does patient have specific questions? May be hard for me to call during regular business hours for the next week.

## 2015-05-16 NOTE — Telephone Encounter (Signed)
Spoke with pt and she stated she received letter about results and wanted to know what was abnormal about the pap smear and why does it have to be repeated in 1 year instead of sooner? She stated that number in chart is good contact number and anytime is ok to call her as she was concerned about this.Will forward to PCP to FU with pt. Audryana Hockenberry, CMA.

## 2015-05-17 NOTE — Telephone Encounter (Signed)
Discussed results with patient and meaning. She voiced understanding. We will do repeat pap testing in a year based off guidelines. Patient had no further questions.

## 2015-05-21 ENCOUNTER — Encounter: Payer: Self-pay | Admitting: Family Medicine

## 2015-05-21 ENCOUNTER — Ambulatory Visit (INDEPENDENT_AMBULATORY_CARE_PROVIDER_SITE_OTHER): Payer: Medicaid Other | Admitting: Family Medicine

## 2015-05-21 VITALS — BP 116/67 | HR 60 | Temp 98.6°F | Ht 67.0 in | Wt 163.2 lb

## 2015-05-21 DIAGNOSIS — F411 Generalized anxiety disorder: Secondary | ICD-10-CM | POA: Diagnosis not present

## 2015-05-21 MED ORDER — SERTRALINE HCL 50 MG PO TABS: 50.0000 mg | ORAL_TABLET | Freq: Every day | ORAL | Status: DC

## 2015-05-21 NOTE — Progress Notes (Signed)
Dr. Pollie Meyer requested a Behavioral Health Consult.  Presenting Issue: Dr. Pollie Meyer provided most of the history to me (see her note below).  Patient noted increased anxiety and a sense that she is "not moving forward - not progressing" like she wants to.  She notes a fair amount of sadness.  Dr. Pollie Meyer obtained self-report measures.    Report of symptoms: Anxiety and sadness.  Duration of CURRENT symptoms: About one month.  Impact on function: Wants to return to work.  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  She wasn't to go back to work and plans to soon.  She is concerned about the impact her "anxiety attacks" might have on her daughter.  Psychiatric History - Diagnoses: None listed.  Denied mental health history. - Hospitalizations:  None - Pharmacotherapy: None - Outpatient therapy: None  Family history of psychiatric issues: Reports her mother has been diagnosed with schizophrenia.  Denies other family history.  Other:  Graduated HS.  Works at a [employer] 32 hours a week.  Does not love it but needs the money.  Wants to go back to school but not sure for what.  Self-Report Measures:  See Dr. Pollie Meyer note  Assessment / Plan / Recommendations:  Suspect she meets criteria for panic attacks and possibly panic disorder as it sounds like she has some anticipatory anxiety.  Also suspect a mood issue.  Language such as feeling "stuck" and lacking motivation and not living up to her potential is present.  I do wonder whether there is some change she knows she needs to make that she has not yet that is impacting mood.  I get the sense that there is a lot she is not discussing presently.  She said she would be open to meeting with someone to discuss.  She is off on Wednesdays so will schedule her with Theresia Bough.  Informed patient that Nelva Bush will be going out on maternity leave some time soon.  Patient voiced an understanding.

## 2015-05-21 NOTE — Assessment & Plan Note (Addendum)
MDQ negative for bipolar spectrum disorders (score 4). GAD-7 score 20 (extremely difficult). PHQ-9 score 17 (extremely difficult). Clearly patient has significant anxiety and some depressive features. With negative MDQ screen, doubt bipolar disorder.  Behavioral Health integrated care consult was performed today, see Dr. Tod Persia note for details.   Note: After Dr. Gwenlyn Saran met with patient, when I went to follow-up with her in the room, patient had gone. Per nursing staff, patient left, stated the room was cold and she was tired of waiting.   I called patient after visit this afternoon to discuss starting Zoloft. She is agreeable to starting Zoloft 50 mg daily. Reviewed with her things to watch for, including increased anxiety, hypomanic symptoms, or thoughts of hurting herself or others. Discussed reasons to go to the emergency room. Follow-up visit scheduled with me in one week to ensure she is tolerating this medication. We will also revisit at that time whether she wants to seek formal therapy outside of the family [place], or continue to follow with our [place] consultants.
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
## 2015-05-29 ENCOUNTER — Ambulatory Visit: Payer: Medicaid Other | Admitting: Family Medicine

## 2015-05-31 ENCOUNTER — Inpatient Hospital Stay (HOSPITAL_COMMUNITY)
Admission: AD | Admit: 2015-05-31 | Discharge: 2015-05-31 | Disposition: A | Discharge status: Home / Self Care | Payer: Medicaid Other | Source: Ambulatory Visit | Attending: Family Medicine | Admitting: Family Medicine

## 2015-05-31 ENCOUNTER — Encounter: Payer: Self-pay | Admitting: Family Medicine

## 2015-05-31 ENCOUNTER — Inpatient Hospital Stay (HOSPITAL_COMMUNITY): Payer: Medicaid Other

## 2015-05-31 ENCOUNTER — Ambulatory Visit (INDEPENDENT_AMBULATORY_CARE_PROVIDER_SITE_OTHER): Payer: Medicaid Other | Admitting: Family Medicine

## 2015-05-31 ENCOUNTER — Encounter (HOSPITAL_COMMUNITY): Payer: Self-pay | Admitting: *Deleted

## 2015-05-31 VITALS — BP 126/72 | HR 63 | Temp 98.3°F | Ht 67.0 in | Wt 160.6 lb

## 2015-05-31 DIAGNOSIS — R102 Pelvic and perineal pain: Secondary | ICD-10-CM | POA: Diagnosis not present

## 2015-05-31 DIAGNOSIS — Z975 Presence of (intrauterine) contraceptive device: Secondary | ICD-10-CM

## 2015-05-31 DIAGNOSIS — N949 Unspecified condition associated with female genital organs and menstrual cycle: Secondary | ICD-10-CM | POA: Diagnosis not present

## 2015-05-31 DIAGNOSIS — T839XXD Unspecified complication of genitourinary prosthetic device, implant and graft, subsequent encounter: Secondary | ICD-10-CM | POA: Diagnosis not present

## 2015-05-31 DIAGNOSIS — Z30432 Encounter for removal of intrauterine contraceptive device: Secondary | ICD-10-CM | POA: Diagnosis not present

## 2015-05-31 DIAGNOSIS — Z538 Procedure and treatment not carried out for other reasons: Secondary | ICD-10-CM | POA: Insufficient documentation

## 2015-05-31 DIAGNOSIS — T8389XD Other specified complication of genitourinary prosthetic devices, implants and grafts, subsequent encounter: Secondary | ICD-10-CM

## 2015-05-31 DIAGNOSIS — R109 Unspecified abdominal pain: Secondary | ICD-10-CM | POA: Insufficient documentation

## 2015-05-31 HISTORY — DX: Anxiety disorder, unspecified: F41.9

## 2015-05-31 MED ORDER — OXYCODONE-ACETAMINOPHEN 5-325 MG PO TABS
2.0000 | ORAL_TABLET | Freq: Once | ORAL | Status: AC
  Administered 2015-05-31: 2 via ORAL
  Filled 2015-05-31 (×2): qty 2

## 2015-05-31 MED ORDER — KETOROLAC TROMETHAMINE 60 MG/2ML IM SOLN
60.0000 mg | INTRAMUSCULAR | Status: AC
  Administered 2015-05-31: 60 mg via INTRAMUSCULAR
  Filled 2015-05-31: qty 2

## 2015-05-31 MED ORDER — ETONOGESTREL-ETHINYL ESTRADIOL 0.12-0.015 MG/24HR VA RING: VAGINAL_RING | VAGINAL | Status: AC

## 2015-05-31 MED ORDER — ACETAMINOPHEN 500 MG PO TABS
500.0000 mg | ORAL_TABLET | Freq: Once | ORAL | Status: AC
  Administered 2015-05-31: 500 mg via ORAL

## 2015-05-31 NOTE — MAU Note (Signed)
Pt stated that she went to [facility] today to get her IUD removed and it it stuck they could not remove it. Pt is in a lot of pain.C/O vag bleeding.

## 2015-05-31 NOTE — MAU Provider Note (Signed)
Chief Complaint: Foreign Body in Vagina   First Provider Initiated Contact with Patient 05/31/15 1327      SUBJECTIVE HPI: Emily Oconnor is a 24 y.o. G2P1011 who presents to maternity admissions reporting recent increase in cramping and irregular spotting with Mirena IUD so she made appointment to have it removed. Today at [facility] office, attempt at removal was unsuccessful.  Now, pt reports significant abdominal cramping and pelvic pain with onset after appointment today.  She is concerned the IUD is imbedded in the uterus or that something else is wrong.  She denies vaginal bleeding, vaginal itching/burning, urinary symptoms, h/a, dizziness, n/v, or fever/chills.     Abdominal Pain This is a new problem. The current episode started today. The onset quality is sudden. The problem occurs intermittently. The problem has been unchanged. The pain is located in the LLQ, RLQ and suprapubic region. The pain is severe. The quality of the pain is cramping. The abdominal pain radiates to the pelvis. Pertinent negatives include no constipation, diarrhea, dysuria, fever, frequency, headaches, nausea or vomiting. Nothing aggravates the pain. She has tried nothing for the symptoms.    Past Medical History  Diagnosis Date  . Asthma   . Anxiety    Past Surgical History  Procedure Laterality Date  . No past surgeries     Social History   Social History  . Marital Status: Single    Spouse Name: N/A  . Number of Children: N/A  . Years of Education: N/A   Occupational History  . Not on file.   Social History Main Topics  . Smoking status: Never Smoker   . Smokeless tobacco: Never Used  . Alcohol Use: Yes  . Drug Use: No  . Sexual Activity: Yes    Birth Control/ Protection: IUD   Other Topics Concern  . Not on file   Social History Narrative   No current facility-administered medications on file prior to encounter.   Current Outpatient Prescriptions on File Prior to Encounter  Medication  Sig Dispense Refill  . sertraline (ZOLOFT) 50 MG tablet Take 1 tablet (50 mg total) by mouth daily. 30 tablet 0  . ibuprofen (ADVIL,MOTRIN) 600 MG tablet Take 1 tablet (600 mg total) by mouth every 6 (six) hours. (Patient not taking: Reported on 05/31/2015) 30 tablet 0   No Known Allergies  ROS:  Review of Systems  Constitutional: Negative for fever, chills and fatigue.  HENT: Negative for sinus pressure.   Eyes: Negative for photophobia.  Respiratory: Negative for shortness of breath.   Cardiovascular: Negative for chest pain.  Gastrointestinal: Positive for abdominal pain. Negative for nausea, vomiting, diarrhea and constipation.  Genitourinary: Negative for dysuria, frequency, flank pain, vaginal bleeding, vaginal discharge, difficulty urinating, vaginal pain and pelvic pain.  Musculoskeletal: Negative for neck pain.  Neurological: Negative for dizziness, weakness and headaches.  Psychiatric/Behavioral: Negative.      I have reviewed patient's Past Medical Hx, Surgical Hx, Family Hx, Social Hx, medications and allergies.   Physical Exam   Patient Vitals for the past 24 hrs:  BP Temp Temp src Pulse Resp Height Weight  05/31/15 1701 127/71 mmHg 98.2 F (36.8 C) Oral (!) 56 18 - -  05/31/15 1254 127/75 mmHg 98.1 F (36.7 C) - 69 18 5\' 7"  (1.702 m) 73.029 kg (161 lb)   Constitutional: Well-developed, well-nourished female in no acute distress.  Cardiovascular: normal rate Respiratory: normal effort GI: Abd soft, non-tender. Pos BS x 4 MS: Extremities nontender, no edema, normal ROM Neurologic:  Alert and oriented x 4.  GU: Neg CVAT.  PELVIC EXAM: Cervix pink, visually closed, without lesion, scant clear thin discharge, Mirena strings not visible at os, vaginal walls and external genitalia normal  IUD Removal  Patient was in the dorsal lithotomy position, normal external genitalia was noted.  A speculum was placed in the patient's vagina, normal discharge was noted, no lesions.  The multiparous cervix was visualized, no lesions, no abnormal discharge.  Able to grasp IUD strings inside cervical canal using Kelly clamp, then with ring forceps and with gentle traction, IUD removed and intact. Patient tolerated the procedure well.  Dr Adrian Blackwater present at bedside during removal.   Patient will use Nuvaring for contraception and Rx written. Routine preventative health maintenance measures emphasized.    LAB RESULTS No results found for this or any previous visit (from the past 24 hour(s)).     IMAGING US Transvaginal Non-ob  05/31/2015   CLINICAL DATA:  Pelvic pain and bleeding.  Evaluate IUD.  EXAM: TRANSABDOMINAL AND TRANSVAGINAL ULTRASOUND OF PELVIS  TECHNIQUE: Both transabdominal and transvaginal ultrasound examinations of the pelvis were performed. Transabdominal technique was performed for global imaging of the pelvis including uterus, ovaries, adnexal regions, and pelvic cul-de-sac. It was necessary to proceed with endovaginal exam following the transabdominal exam to visualize the uterus and endometrium.  COMPARISON:  None  FINDINGS: Uterus  Measurements: 9.4 x 4.1 x 4.2 cm. The IUD is identified within the lower uterine segment. The vertical arm of the IUD of appears to penetrate into the right lateral wall of the lower uterus. No fibroids or other mass visualized.  Endometrium  Thickness: 8.1 mm.  No focal abnormality visualized.  Right ovary  Measurements: 3.8 x 2.3 x 3.1 cm. Normal appearance/no adnexal mass.  Left ovary  Measurements: 3.9 x 1.8 x 2.7 cm. Normal appearance/no adnexal mass.  Other findings  Trace free fluid identified within the pelvis.  IMPRESSION: 1. IUD appears situated in the lower uterine segment. The vertical arm of the IUD appears to a penetrate the right lateral myometrium.   Electronically Signed   By: Signa Kell M.D.   On: 05/31/2015 15:48   US Pelvis Complete  05/31/2015   CLINICAL DATA:  Pelvic pain and bleeding.  Evaluate IUD.  EXAM:  TRANSABDOMINAL AND TRANSVAGINAL ULTRASOUND OF PELVIS  TECHNIQUE: Both transabdominal and transvaginal ultrasound examinations of the pelvis were performed. Transabdominal technique was performed for global imaging of the pelvis including uterus, ovaries, adnexal regions, and pelvic cul-de-sac. It was necessary to proceed with endovaginal exam following the transabdominal exam to visualize the uterus and endometrium.  COMPARISON:  None  FINDINGS: Uterus  Measurements: 9.4 x 4.1 x 4.2 cm. The IUD is identified within the lower uterine segment. The vertical arm of the IUD of appears to penetrate into the right lateral wall of the lower uterus. No fibroids or other mass visualized.  Endometrium  Thickness: 8.1 mm.  No focal abnormality visualized.  Right ovary  Measurements: 3.8 x 2.3 x 3.1 cm. Normal appearance/no adnexal mass.  Left ovary  Measurements: 3.9 x 1.8 x 2.7 cm. Normal appearance/no adnexal mass.  Other findings  Trace free fluid identified within the pelvis.  IMPRESSION: 1. IUD appears situated in the lower uterine segment. The vertical arm of the IUD appears to a penetrate the right lateral myometrium.   Electronically Signed   By: Signa Kell M.D.   On: 05/31/2015 15:48    MAU Management/MDM: Ordered imaging and reviewed results.  Consult Dr Adrian Blackwater to review assessment and imaging.  Treatments in MAU included Toradol 60 mg IM and Percocet 5/325 x 2 tabs prior to IUD removal.  Pt reported no pain prior to procedure and tolerated procedure well. Pt stable at time of discharge.  ASSESSMENT 1. Acute pelvic pain, female   2. IUD complication, subsequent encounter   3. Acute pelvic pain, female   4. IUD complication, subsequent encounter     PLAN Discharge home Ibuprofen 600 mg Q 6 hours PRN Rx for Nuvaring x 12 refills.  Pt denies personal or family hx of blood clots and denies migraines with aura.  BP wnl today. Reviewed s/sx of DVT/reasons to come to hospital.   Follow-up Information     [facility] with [facility]   Why:  As needed   Contact information:   [address] [place] [address]       Follow up with [facility] [place] [facility].   Why:  As needed for emergencies   Contact information:   [address] [id number] mc [place] [address] [phone]      Sharen Counter Certified Nurse-Midwife 06/01/2015  8:47 AM

## 2015-05-31 NOTE — Addendum Note (Signed)
Addended by: Clovis Pu on: 05/31/2015 01:42 PM   Modules accepted: Orders

## 2015-05-31 NOTE — Discharge Instructions (Signed)
Pelvic Pain Female pelvic pain can be caused by many different things and start from a variety of places. Pelvic pain refers to pain that is located in the lower half of the abdomen and between your hips. The pain may occur over a short period of time (acute) or may be reoccurring (chronic). The cause of pelvic pain may be related to disorders affecting the female reproductive organs (gynecologic), but it may also be related to the bladder, kidney stones, an intestinal complication, or muscle or skeletal problems. Getting help right away for pelvic pain is important, especially if there has been severe, sharp, or a sudden onset of unusual pain. It is also important to get help right away because some types of pelvic pain can be life threatening.  CAUSES  Below are only some of the causes of pelvic pain. The causes of pelvic pain can be in one of several categories.   Gynecologic.  Pelvic inflammatory disease.  Sexually transmitted infection.  Ovarian cyst or a twisted ovarian ligament (ovarian torsion).  Uterine lining that grows outside the uterus (endometriosis).  Fibroids, cysts, or tumors.  Ovulation.  Pregnancy.  Pregnancy that occurs outside the uterus (ectopic pregnancy).  Miscarriage.  Labor.  Abruption of the placenta or ruptured uterus.  Infection.  Uterine infection (endometritis).  Bladder infection.  Diverticulitis.  Miscarriage related to a uterine infection (septic abortion).  Bladder.  Inflammation of the bladder (cystitis).  Kidney stone(s).  Gastrointestinal.  Constipation.  Diverticulitis.  Neurologic.  Trauma.  Feeling pelvic pain because of mental or emotional causes (psychosomatic).  Cancers of the bowel or pelvis. EVALUATION  Your caregiver will want to take a careful history of your concerns. This includes recent changes in your health, a careful gynecologic history of your periods (menses), and a sexual history. Obtaining your family  history and medical history is also important. Your caregiver may suggest a pelvic exam. A pelvic exam will help identify the location and severity of the pain. It also helps in the evaluation of which organ system may be involved. In order to identify the cause of the pelvic pain and be properly treated, your caregiver may order tests. These tests may include:   A pregnancy test.  Pelvic ultrasonography.  An X-ray exam of the abdomen.  A urinalysis or evaluation of vaginal discharge.  Blood tests. HOME CARE INSTRUCTIONS   Only take over-the-counter or prescription medicines for pain, discomfort, or fever as directed by your caregiver.   Rest as directed by your caregiver.   Eat a balanced diet.   Drink enough fluids to make your urine clear or pale yellow, or as directed.   Avoid sexual intercourse if it causes pain.   Apply warm or cold compresses to the lower abdomen depending on which one helps the pain.   Avoid stressful situations.   Keep a journal of your pelvic pain. Write down when it started, where the pain is located, and if there are things that seem to be associated with the pain, such as food or your menstrual cycle.  Follow up with your caregiver as directed.  SEEK MEDICAL CARE IF:  Your medicine does not help your pain.  You have abnormal vaginal discharge. SEEK IMMEDIATE MEDICAL CARE IF:   You have heavy bleeding from the vagina.   Your pelvic pain increases.   You feel light-headed or faint.   You have chills.   You have pain with urination or blood in your urine.   You have uncontrolled diarrhea   or vomiting.   You have a fever or persistent symptoms for more than 3 days.  You have a fever and your symptoms suddenly get worse.   You are being physically or sexually abused.  MAKE SURE YOU:  Understand these instructions.  Will watch your condition.  Will get help if you are not doing well or get worse. Document Released:  08/05/2004 Document Revised: 01/23/2014 Document Reviewed: 12/29/2011 ExitCare Patient Information 2015 [employer]. This information is not intended to replace advice given to you by your health care provider. Make sure you discuss any questions you have with your health care provider.  

## 2015-05-31 NOTE — Assessment & Plan Note (Signed)
Attempted Removal of IUD: Unsuccessful Removal. minimal bleeding, and patient complained of some pain.  - Patient was referred to [facility] for complications due to unsuccessful removal of IUD. -  Patient was advised to take Ibuprofen for pain and discomfort, and give appropriate return precautions for infection or bleeding were given.

## 2015-05-31 NOTE — Progress Notes (Signed)
Patient ID: Emily Oconnor, female   DOB: 1991-04-10, 24 y.o.   MRN: [id number] Subjective :  Patient presents for IUD removal. Was inserted 2 years ago. Wants it removed due to discomfort after coitus. No reported vaginal bleeding or discharge.   ROS: No reported fevers or chills.   Objective  Blood pressure 126/72, pulse 63, temperature 98.3 F (36.8 C), height  (1.702 m), weight 160 lb 9.6 oz (72.848 kg), unknown if currently breastfeeding. General: NAD, well appearing female  GU: Normal external female genitalia, normal cervix, no discharge, strings of IUD visualized   IUD Removal  Patient was in the dorsal lithotomy position, normal external genitalia was noted.  A speculum was placed in the patient's vagina, normal discharge was noted, no lesions. The cervix was visualized, no lesions, no abnormal discharge,  and was swabbed using scopettes.  The strings of the IUD was grasped and pulled using ring forceps. Mulitple attempts were made without success. Dr. Jimmey Ralph attempted to pull out the IUD as well stablizing cervix with tenaculum, and pulling on strings with ringed forceps without success. Dr. Lum Babe was consulted and attempted without success. There was some minimal bleeding, and patient complained of some pain. Patient was referred to [facility] for complications due to unsuccessful removal of IUD. Patient was advised to take Ibuprofen for pain and discomfort, and give appropriate return precautions for infection or bleeding were given.    A/P Attempted Removal of IUD: Unsuccessful Removal. minimal bleeding, and patient complained of some pain.  - Patient was referred to [facility] for complications due to unsuccessful removal of IUD. -  Patient was advised to take Ibuprofen for pain and discomfort, and give appropriate return precautions for infection or bleeding were given.

## 2015-05-31 NOTE — Patient Instructions (Signed)
We will send in a urgent referral to OBGYN and they will contact you to set up an appointment as soon as possible. You should expect to have some pain and discomfort. Please take Ibuprofen for the pain. You will also expect to have some vaginal bleeding of over the course of the next 24 hours. Please call or go to [facility] if you have severe pain, fever, significant change in discharge.

## 2015-06-11 ENCOUNTER — Ambulatory Visit: Payer: Medicaid Other | Admitting: Family Medicine

## 2015-06-22 ENCOUNTER — Other Ambulatory Visit: Payer: Self-pay | Admitting: Family Medicine

## 2015-06-24 ENCOUNTER — Other Ambulatory Visit: Payer: Self-pay | Admitting: Family Medicine

## 2015-06-25 NOTE — Telephone Encounter (Signed)
Needs office visit.  Will refill x1.  Please advise patient.

## 2015-06-25 NOTE — Telephone Encounter (Signed)
Patient made appt for 06-27-15 with Dr. Shawnie Pons to follow up on her anxiety.  PCP not available until November. Jazmin Hartsell,CMA

## 2015-06-27 ENCOUNTER — Ambulatory Visit: Payer: Medicaid Other | Admitting: Family Medicine

## 2015-10-02 ENCOUNTER — Other Ambulatory Visit: Payer: Self-pay | Admitting: Family Medicine

## 2015-10-02 NOTE — Telephone Encounter (Signed)
Please have patient schedule a follow-up appointment so I can check on how she is doing on the Zoloft.

## 2015-12-30 ENCOUNTER — Other Ambulatory Visit: Payer: Self-pay | Admitting: Obstetrics and Gynecology

## 2016-01-12 IMAGING — US US TRANSVAGINAL NON-OB
1 series · 15 of 25 positions shown · non-contrast
Comparison: None

CLINICAL DATA: Pelvic pain and bleeding.  Evaluate IUD.

EXAM:
TRANSABDOMINAL AND TRANSVAGINAL ULTRASOUND OF PELVIS
TECHNIQUE: Both transabdominal and transvaginal ultrasound examinations of the
pelvis were performed. Transabdominal technique was performed for
global imaging of the pelvis including uterus, ovaries, adnexal
regions, and pelvic cul-de-sac. It was necessary to proceed with
endovaginal exam following the transabdominal exam to visualize the
uterus and endometrium..

[Series 1: us transvaginal non-ob · 86 acquisitions, 15 frames shown]
[im 1/86]
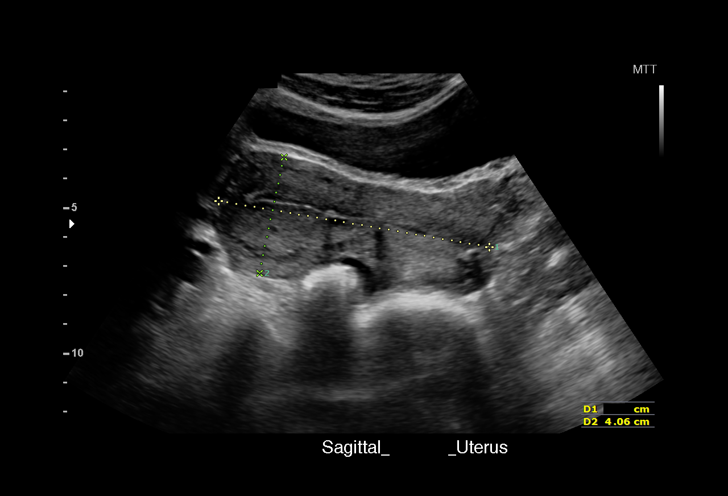
[im 8/86]
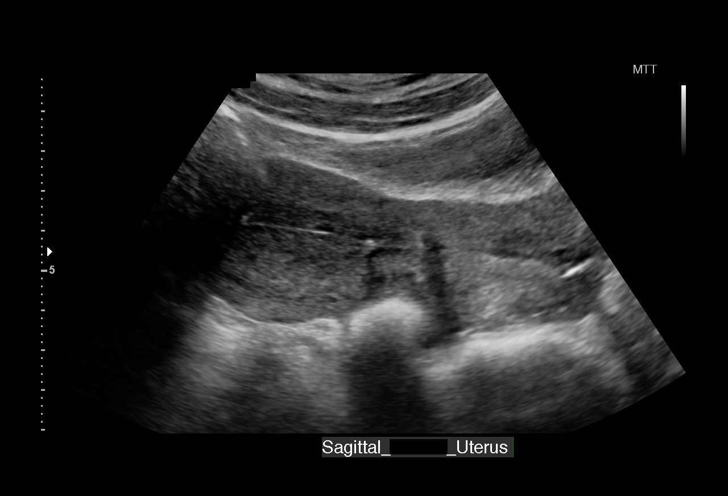
[im 15/86]
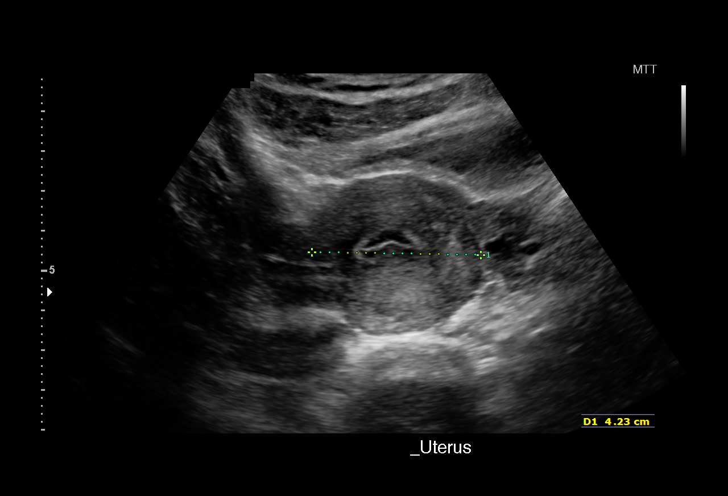
[im 18/86]
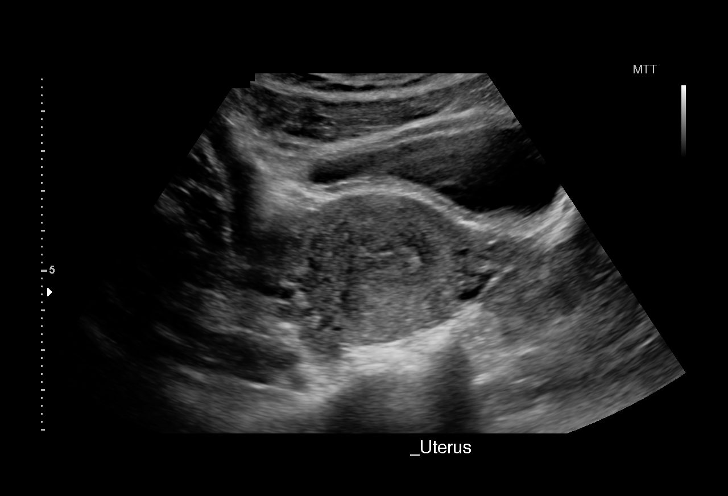
[im 25/86]
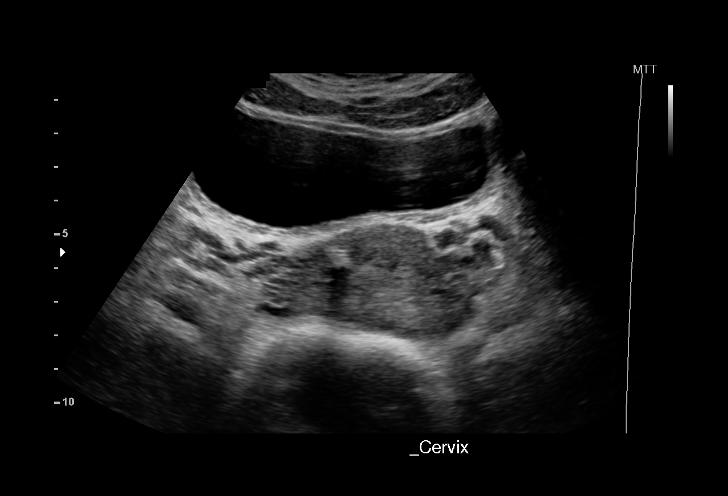
[im 32/86]
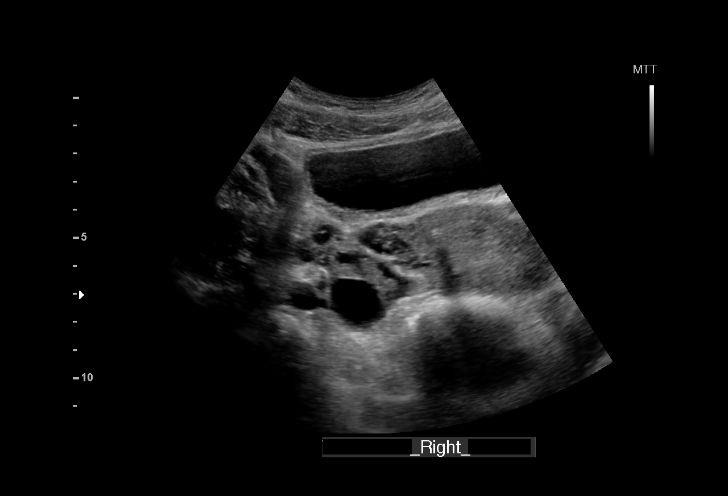
[im 36/86]
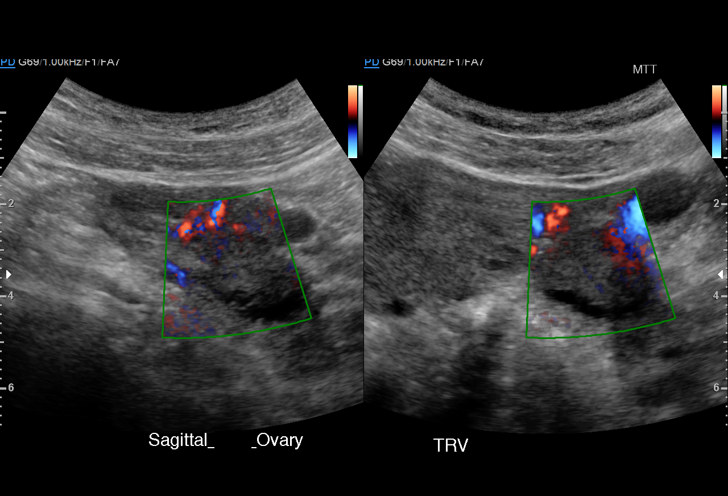
[im 43/86]
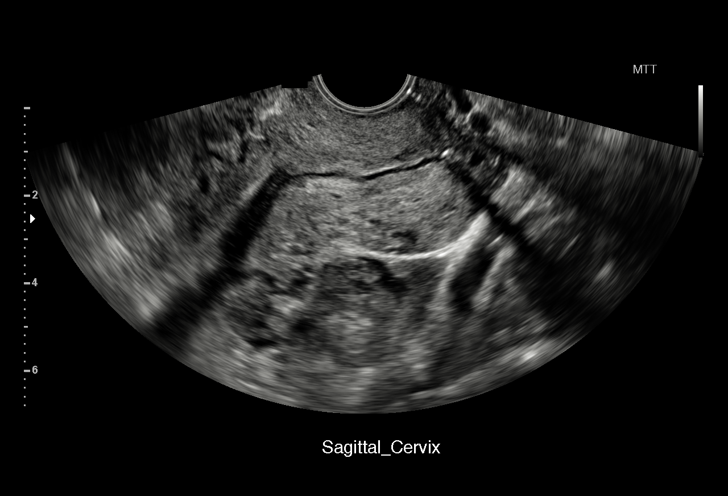
[im 50/86]
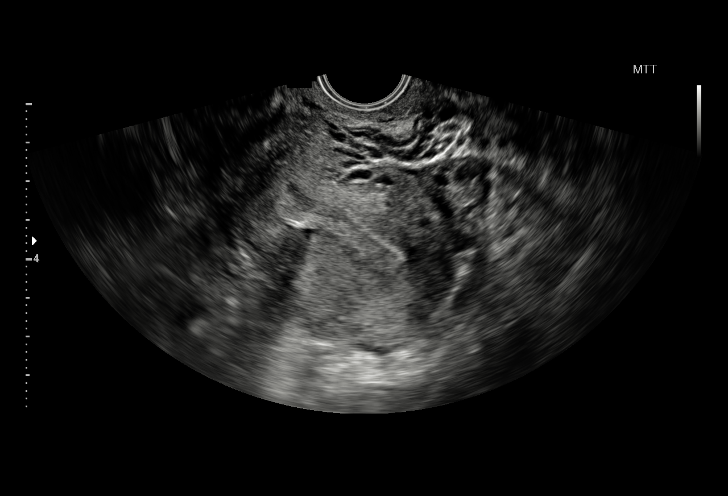
[im 54/86]
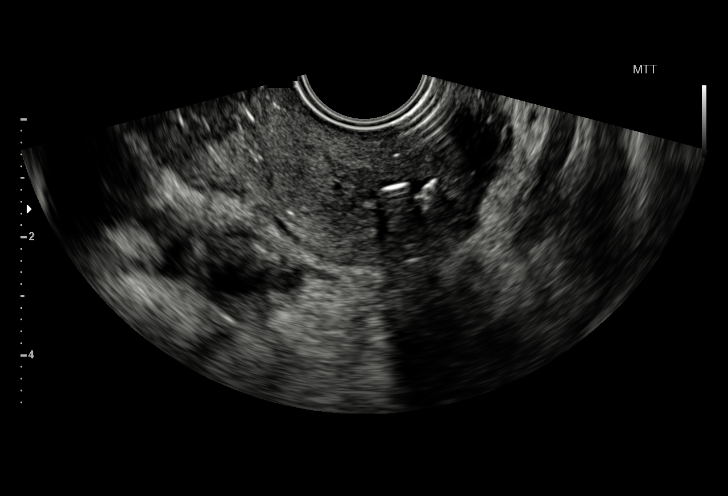
[im 61/86]
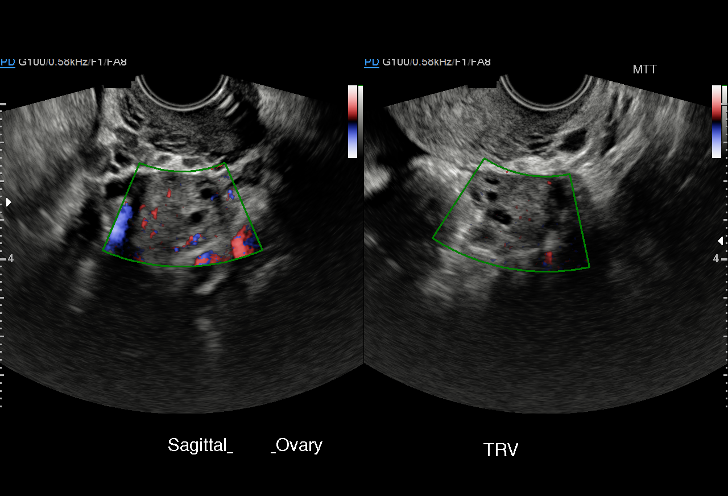
[im 68/86]
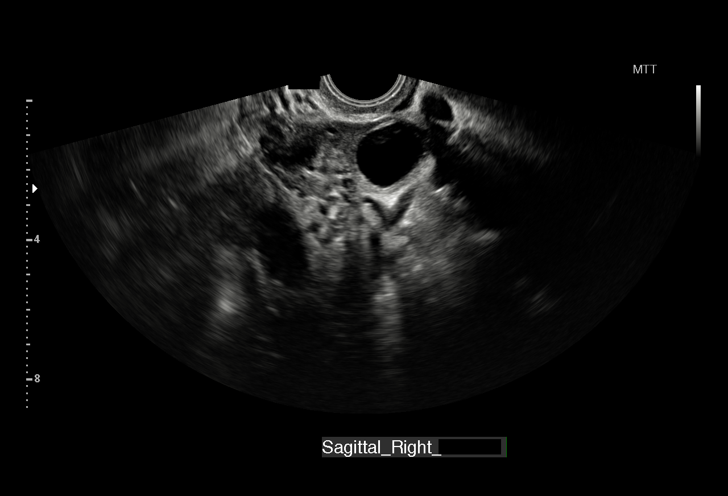
[im 71/86]
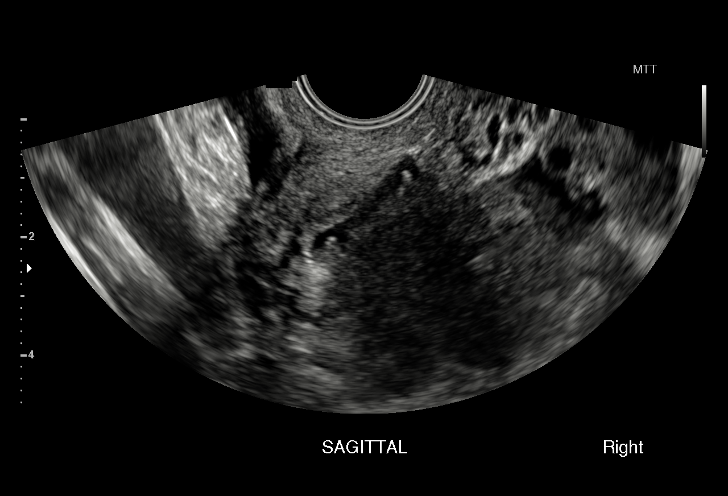
[im 78/86]
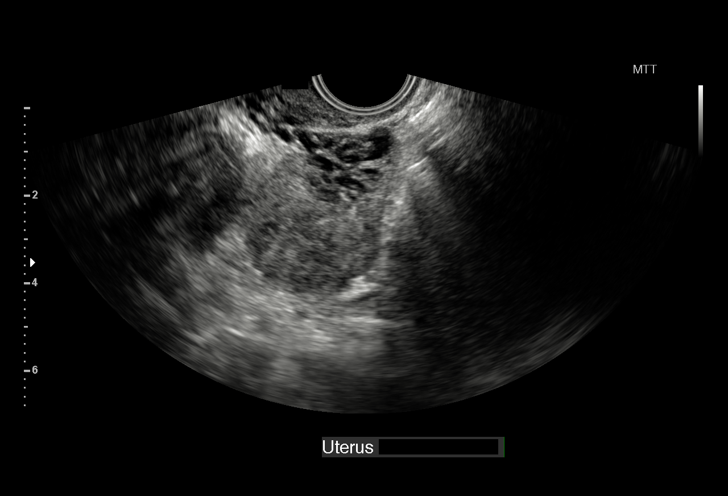
[im 86/86]
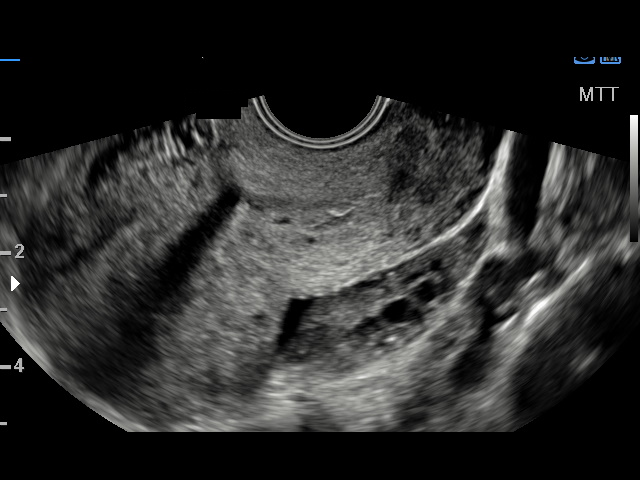

[15 of 25 positions shown; findings below may reference images not displayed]

FINDINGS: Uterus

Measurements: 9.4 x 4.1 x 4.2 cm. The IUD is identified within the
lower uterine segment. The vertical arm of the IUD of appears to
penetrate into the right lateral wall of the lower uterus. No
fibroids or other mass visualized.

Endometrium

Thickness: 8.1 mm.  No focal abnormality visualized.

Right ovary

Measurements: 3.8 x 2.3 x 3.1 cm. Normal appearance/no adnexal mass.

Left ovary

Measurements: 3.9 x 1.8 x 2.7 cm. Normal appearance/no adnexal mass.

Other findings

Trace free fluid identified within the pelvis.
IMPRESSION: 1. IUD appears situated in the lower uterine segment. The vertical
arm of the IUD appears to a penetrate the right lateral myometrium.

## 2020-12-13 ENCOUNTER — Other Ambulatory Visit: Payer: Self-pay

## 2020-12-13 ENCOUNTER — Encounter (HOSPITAL_COMMUNITY): Payer: Self-pay

## 2020-12-13 ENCOUNTER — Emergency Department (HOSPITAL_COMMUNITY)
Admission: EM | Admit: 2020-12-13 | Discharge: 2020-12-13 | Disposition: A | Discharge status: Home / Self Care | Payer: BLUE CROSS/BLUE SHIELD | Attending: Emergency Medicine | Admitting: Emergency Medicine

## 2020-12-13 DIAGNOSIS — J45909 Unspecified asthma, uncomplicated: Secondary | ICD-10-CM | POA: Insufficient documentation

## 2020-12-13 DIAGNOSIS — R1013 Epigastric pain: Secondary | ICD-10-CM | POA: Diagnosis present

## 2020-12-13 DIAGNOSIS — R112 Nausea with vomiting, unspecified: Secondary | ICD-10-CM | POA: Insufficient documentation

## 2020-12-13 DIAGNOSIS — M545 Low back pain, unspecified: Secondary | ICD-10-CM | POA: Diagnosis not present

## 2020-12-13 LAB — COMPREHENSIVE METABOLIC PANEL
ALT: 17 U/L (ref 0–44)
AST: 23 U/L (ref 15–41)
Albumin: 4.1 g/dL (ref 3.5–5.0)
Alkaline Phosphatase: 33 U/L — ABNORMAL LOW (ref 38–126)
Anion gap: 8 (ref 5–15)
BUN: 7 mg/dL (ref 6–20)
CO2: 22 mmol/L (ref 22–32)
Calcium: 9.4 mg/dL (ref 8.9–10.3)
Chloride: 104 mmol/L (ref 98–111)
Creatinine, Ser: 0.67 mg/dL (ref 0.44–1.00)
GFR, Estimated: >60 mL/min (ref >60)
Glucose, Bld: 148 mg/dL — ABNORMAL HIGH (ref 70–99)
Potassium: 3.6 mmol/L (ref 3.5–5.1)
Sodium: 134 mmol/L — ABNORMAL LOW (ref 135–145)
Total Bilirubin: 0.6 mg/dL (ref 0.3–1.2)
Total Protein: 7.1 g/dL (ref 6.5–8.1)

## 2020-12-13 LAB — URINALYSIS, ROUTINE W REFLEX MICROSCOPIC
Bilirubin Urine: NEGATIVE
Glucose, UA: NEGATIVE mg/dL
Hgb urine dipstick: NEGATIVE
Ketones, ur: 20 mg/dL — AB
Leukocytes,Ua: NEGATIVE
Nitrite: NEGATIVE
Protein, ur: 30 mg/dL — AB
Specific Gravity, Urine: 1.025 (ref 1.005–1.030)
pH: 6 (ref 5.0–8.0)

## 2020-12-13 LAB — CBC
HCT: 40.3 % (ref 36.0–46.0)
Hemoglobin: 12.8 g/dL (ref 12.0–15.0)
MCH: 26.7 pg (ref 26.0–34.0)
MCHC: 31.8 g/dL (ref 30.0–36.0)
MCV: 84.1 fL (ref 80.0–100.0)
Platelets: 246 10*3/uL (ref 150–400)
RBC: 4.79 MIL/uL (ref 3.87–5.11)
RDW: 13.5 % (ref 11.5–15.5)
WBC: 9.9 10*3/uL (ref 4.0–10.5)
nRBC: 0 % (ref 0.0–0.2)

## 2020-12-13 LAB — LIPASE, BLOOD: Lipase: 40 U/L (ref 11–51)

## 2020-12-13 LAB — I-STAT BETA HCG BLOOD, ED (MC, WL, AP ONLY): I-stat hCG, quantitative: <5 m[IU]/mL (ref <5)

## 2020-12-13 MED ORDER — ONDANSETRON 4 MG PO TBDP: 4.0000 mg | ORAL_TABLET | Freq: Three times a day (TID) | ORAL | 0 refills | Status: AC | PRN

## 2020-12-13 MED ORDER — ACETAMINOPHEN 325 MG PO TABS
650.0000 mg | ORAL_TABLET | Freq: Once | ORAL | Status: AC
  Administered 2020-12-13: 650 mg via ORAL
  Filled 2020-12-13: qty 2

## 2020-12-13 MED ORDER — ONDANSETRON 4 MG PO TBDP
4.0000 mg | ORAL_TABLET | Freq: Once | ORAL | Status: AC | PRN
  Administered 2020-12-13: 4 mg via ORAL
  Filled 2020-12-13: qty 1

## 2020-12-13 NOTE — ED Triage Notes (Signed)
Abdominal pain radiating to the back, nausea and vomiting since 12noon. Denies any diarrhea, constipation and vaginal bleeding.
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
## 2020-12-15 LAB — URINE CULTURE: Culture: >=100000 — AB
